# Patient Record
Sex: Female | Born: 1985 | Race: White | Hispanic: No | Marital: Married | Smoking: Never smoker
Health system: Southern US, Community
[De-identification: ages and names within clinical notes are randomized; demographics above are authoritative.]

## PROBLEM LIST (undated history)

## (undated) DIAGNOSIS — R112 Nausea with vomiting, unspecified: Secondary | ICD-10-CM

## (undated) DIAGNOSIS — R519 Headache, unspecified: Secondary | ICD-10-CM

## (undated) DIAGNOSIS — B2790 Infectious mononucleosis, unspecified without complication: Secondary | ICD-10-CM

## (undated) DIAGNOSIS — Z973 Presence of spectacles and contact lenses: Secondary | ICD-10-CM

## (undated) DIAGNOSIS — R51 Headache: Secondary | ICD-10-CM

## (undated) DIAGNOSIS — F419 Anxiety disorder, unspecified: Secondary | ICD-10-CM

## (undated) DIAGNOSIS — E559 Vitamin D deficiency, unspecified: Secondary | ICD-10-CM

## (undated) DIAGNOSIS — N83209 Unspecified ovarian cyst, unspecified side: Secondary | ICD-10-CM

## (undated) DIAGNOSIS — L732 Hidradenitis suppurativa: Secondary | ICD-10-CM

## (undated) DIAGNOSIS — K219 Gastro-esophageal reflux disease without esophagitis: Secondary | ICD-10-CM

## (undated) DIAGNOSIS — Z9889 Other specified postprocedural states: Secondary | ICD-10-CM

## (undated) HISTORY — DX: Infectious mononucleosis, unspecified without complication: B27.90

## (undated) HISTORY — DX: Vitamin D deficiency, unspecified: E55.9

## (undated) HISTORY — PX: OTHER SURGICAL HISTORY: SHX169

## (undated) HISTORY — DX: Hidradenitis suppurativa: L73.2

---

## 2007-02-28 DIAGNOSIS — E785 Hyperlipidemia, unspecified: Secondary | ICD-10-CM | POA: Insufficient documentation

## 2007-04-06 DIAGNOSIS — G43909 Migraine, unspecified, not intractable, without status migrainosus: Secondary | ICD-10-CM | POA: Insufficient documentation

## 2007-04-06 DIAGNOSIS — F411 Generalized anxiety disorder: Secondary | ICD-10-CM | POA: Insufficient documentation

## 2008-04-03 DIAGNOSIS — M545 Low back pain, unspecified: Secondary | ICD-10-CM | POA: Insufficient documentation

## 2008-10-16 HISTORY — PX: OTHER SURGICAL HISTORY: SHX169

## 2009-10-16 HISTORY — PX: OTHER SURGICAL HISTORY: SHX169

## 2011-10-20 HISTORY — PX: ABDOMINAL HYSTERECTOMY: SHX81

## 2012-04-23 ENCOUNTER — Other Ambulatory Visit: Payer: Self-pay | Admitting: Obstetrics & Gynecology

## 2012-04-23 DIAGNOSIS — N644 Mastodynia: Secondary | ICD-10-CM

## 2012-04-30 ENCOUNTER — Ambulatory Visit
Admission: RE | Admit: 2012-04-30 | Discharge: 2012-04-30 | Disposition: A | Discharge status: Home / Self Care | Payer: BC Managed Care – PPO | Source: Ambulatory Visit | Attending: Obstetrics & Gynecology | Admitting: Obstetrics & Gynecology

## 2012-04-30 DIAGNOSIS — N644 Mastodynia: Secondary | ICD-10-CM

## 2013-04-02 ENCOUNTER — Ambulatory Visit (INDEPENDENT_AMBULATORY_CARE_PROVIDER_SITE_OTHER): Payer: 59 | Admitting: Cardiology

## 2013-04-02 ENCOUNTER — Encounter (INDEPENDENT_AMBULATORY_CARE_PROVIDER_SITE_OTHER): Payer: Self-pay

## 2013-04-02 VITALS — BP 102/80 | HR 68 | Ht 59.0 in | Wt 109.0 lb

## 2013-04-02 DIAGNOSIS — R002 Palpitations: Secondary | ICD-10-CM

## 2013-04-02 LAB — CBC WITH DIFFERENTIAL/PLATELET
Basophils Absolute: 0 10*3/uL (ref 0.0–0.1)
Basophils Relative: 0.4 % (ref 0.0–3.0)
Eosinophils Absolute: 0 10*3/uL (ref 0.0–0.7)
Eosinophils Relative: 0.4 % (ref 0.0–5.0)
HCT: 39 % (ref 36.0–46.0)
Hemoglobin: 13.2 g/dL (ref 12.0–15.0)
Lymphocytes Relative: 35.2 % (ref 12.0–46.0)
Lymphs Abs: 2.6 10*3/uL (ref 0.7–4.0)
MCHC: 33.9 g/dL (ref 30.0–36.0)
MCV: 88.7 fl (ref 78.0–100.0)
Monocytes Absolute: 0.6 10*3/uL (ref 0.1–1.0)
Monocytes Relative: 7.7 % (ref 3.0–12.0)
Neutro Abs: 4.1 10*3/uL (ref 1.4–7.7)
Neutrophils Relative %: 56.3 % (ref 43.0–77.0)
Platelets: 290 10*3/uL (ref 150.0–400.0)
RBC: 4.4 Mil/uL (ref 3.87–5.11)
RDW: 13.1 % (ref 11.5–14.6)
WBC: 7.3 10*3/uL (ref 4.5–10.5)

## 2013-04-02 LAB — COMPREHENSIVE METABOLIC PANEL
ALT: 16 U/L (ref 0–35)
AST: 15 U/L (ref 0–37)
Albumin: 4.6 g/dL (ref 3.5–5.2)
Alkaline Phosphatase: 42 U/L (ref 39–117)
BUN: 9 mg/dL (ref 6–23)
CO2: 25 mEq/L (ref 19–32)
Calcium: 9.2 mg/dL (ref 8.4–10.5)
Chloride: 104 mEq/L (ref 96–112)
Creatinine, Ser: 0.6 mg/dL (ref 0.4–1.2)
GFR: 139.81 mL/min (ref >60.00)
Glucose, Bld: 84 mg/dL (ref 70–99)
Potassium: 3.8 mEq/L (ref 3.5–5.1)
Sodium: 138 mEq/L (ref 135–145)
Total Bilirubin: 0.7 mg/dL (ref 0.3–1.2)
Total Protein: 7.5 g/dL (ref 6.0–8.3)

## 2013-04-02 LAB — TSH: TSH: 1.66 u[IU]/mL (ref 0.35–5.50)

## 2013-04-02 NOTE — Progress Notes (Signed)
Patient ID: Berdella Bacot, female   DOB: 29-Oct-1985, 27 y.o.   MRN: [id number]     Patient Name: Kristi Robertson Date of Encounter: 04/02/2013  Primary Care Provider:  No primary provider on file. Primary Cardiologist:  Tobias Alexander, H  Problem List   No past medical history on file. No past surgical history on file.  Allergies  Allergies not on file  HPI  A pleasant 27 year old female with no PMH on no medication who has been experiencing episodes of palpitations. She states that in the past several months she woke up on 2 occasions with palpitations and SOB. She was given Xanax for it. Recently she has been experiencing a lot of stress with her husband illness and frequent travelling. She states that this time her palpitations feel like extra beats followed by a pause and they are happening on a regular basis all day long. No obvious precipitating or alleviating factors. No associated symptoms. She is very physically active and has no limitations.   Home Medications  Prior to Admission medications   Not on File   Family History  No family history on file.  Social History  History   Social History  . Marital Status: Married    Spouse Name: N/A    Number of Children: N/A  . Years of Education: N/A   Occupational History  . Not on file.   Social History Main Topics  . Smoking status: Not on file  . Smokeless tobacco: Not on file  . Alcohol Use: Not on file  . Drug Use: Not on file  . Sexual Activity: Not on file   Other Topics Concern  . Not on file   Social History Narrative  . No narrative on file     Review of Systems, as per HPI, otherwise negative General:  No chills, fever, night sweats or weight changes.  Cardiovascular:  No chest pain, dyspnea on exertion, edema, orthopnea, palpitations, paroxysmal nocturnal dyspnea. Dermatological: No rash, lesions/masses Respiratory: No cough, dyspnea Urologic: No hematuria, dysuria Abdominal:   No  nausea, vomiting, diarrhea, bright red blood per rectum, melena, or hematemesis Neurologic:  No visual changes, wkns, changes in mental status. All other systems reviewed and are otherwise negative except as noted above.  Physical Exam  There were no vitals taken for this visit.  General: Pleasant, NAD Psych: Normal affect. Neuro: Alert and oriented X 3. Moves all extremities spontaneously. HEENT: Normal  Neck: Supple without bruits or JVD. Lungs:  Resp regular and unlabored, CTA. Heart: RRR no s3, s4, or murmurs. Abdomen: Soft, non-tender, non-distended, BS + x 4.  Extremities: No clubbing, cyanosis or edema. DP/PT/Radials 2+ and equal bilaterally.  Labs:  No results found for this basename: CKTOTAL, CKMB, TROPONINI,  in the last 72 hours No results found for this basename: WBC, HGB, HCT, MCV, PLT   No results found for this basename: NA, K, CL, CO2, BUN, CREATININE, CALCIUM, LABALBU, PROT, BILITOT, ALKPHOS, ALT, AST, GLUCOSE,  in the last 168 hours No results found for this basename: CHOL, HDL, LDLCALC, TRIG   No results found for this basename: DDIMER   No components found with this basename: POCBNP,   Accessory Clinical Findings  echocardiogram  ECG - NSR, 75 BPM, normal ECG   Assessment & Plan  A 26 year old female with palpitations that are most probably PVCs followed by compensatory pauses. The patient also experiences episodes of sudden onset tachycardi that awakes her from sleep and is associated with  SOB.  We will oredr labs inluding CBC, CMP and TSH and a 48 Hour Holter monitor.  Follow up in 2 weeks.    Lars Masson, MD, [facility] 04/02/2013, 2:16 PM

## 2013-04-02 NOTE — Patient Instructions (Signed)
Schedule 48 hour holter monitor    Lab work today  ( cbc,cmet,tsh )    Your physician recommends that you schedule a follow-up appointment in: 2 weeks.

## 2013-04-03 DIAGNOSIS — R002 Palpitations: Secondary | ICD-10-CM | POA: Insufficient documentation

## 2013-04-04 ENCOUNTER — Telehealth: Payer: Self-pay | Admitting: Cardiology

## 2013-04-04 NOTE — Telephone Encounter (Signed)
New message    Returning a nurses call to get lab results.dp

## 2013-04-04 NOTE — Telephone Encounter (Signed)
Called patient with lab results.  

## 2013-04-05 ENCOUNTER — Encounter: Payer: Self-pay | Admitting: Radiology

## 2013-04-05 ENCOUNTER — Encounter (INDEPENDENT_AMBULATORY_CARE_PROVIDER_SITE_OTHER): Payer: 59

## 2013-04-05 DIAGNOSIS — R002 Palpitations: Secondary | ICD-10-CM

## 2013-04-05 NOTE — Progress Notes (Signed)
Patient ID: Kristi Robertson, female   DOB: 1986-04-09, 27 y.o.   MRN: [id number] E Cardio 48hr holter monitor applied

## 2013-04-10 ENCOUNTER — Other Ambulatory Visit: Payer: Self-pay | Admitting: Gastroenterology

## 2013-04-10 DIAGNOSIS — R1011 Right upper quadrant pain: Secondary | ICD-10-CM

## 2013-04-10 DIAGNOSIS — R110 Nausea: Secondary | ICD-10-CM

## 2013-04-17 ENCOUNTER — Ambulatory Visit: Payer: 59 | Admitting: Cardiology

## 2013-04-17 ENCOUNTER — Telehealth: Payer: Self-pay | Admitting: *Deleted

## 2013-04-17 NOTE — Telephone Encounter (Signed)
Left message on personal voicemail holter monitor results were reported normal by Dr. Henderson Newcomer RN

## 2013-04-19 ENCOUNTER — Other Ambulatory Visit: Payer: 59

## 2013-04-24 ENCOUNTER — Telehealth: Payer: Self-pay

## 2013-04-24 NOTE — Telephone Encounter (Signed)
2nd attempt.lmom pt holter monitor results are normal and were reviewed by Dr.Nelson

## 2013-04-25 ENCOUNTER — Ambulatory Visit
Admission: RE | Admit: 2013-04-25 | Discharge: 2013-04-25 | Disposition: A | Discharge status: Home / Self Care | Payer: 59 | Source: Ambulatory Visit | Attending: Gastroenterology | Admitting: Gastroenterology

## 2013-04-25 DIAGNOSIS — R110 Nausea: Secondary | ICD-10-CM

## 2013-04-25 DIAGNOSIS — R1011 Right upper quadrant pain: Secondary | ICD-10-CM

## 2013-04-25 MED ORDER — IOHEXOL 300 MG/ML  SOLN
100.0000 mL | Freq: Once | INTRAMUSCULAR | Status: AC | PRN
  Administered 2013-04-25: 100 mL via INTRAVENOUS

## 2013-04-30 ENCOUNTER — Other Ambulatory Visit: Payer: Self-pay | Admitting: Gastroenterology

## 2013-04-30 DIAGNOSIS — K769 Liver disease, unspecified: Secondary | ICD-10-CM

## 2013-05-03 ENCOUNTER — Ambulatory Visit (INDEPENDENT_AMBULATORY_CARE_PROVIDER_SITE_OTHER): Payer: 59 | Admitting: Cardiology

## 2013-05-03 ENCOUNTER — Ambulatory Visit
Admission: RE | Admit: 2013-05-03 | Discharge: 2013-05-03 | Disposition: A | Discharge status: Home / Self Care | Payer: 59 | Source: Ambulatory Visit | Attending: Gastroenterology | Admitting: Gastroenterology

## 2013-05-03 ENCOUNTER — Encounter: Payer: Self-pay | Admitting: Cardiology

## 2013-05-03 VITALS — BP 100/70 | HR 80 | Ht 59.0 in | Wt 108.8 lb

## 2013-05-03 DIAGNOSIS — R0789 Other chest pain: Secondary | ICD-10-CM

## 2013-05-03 DIAGNOSIS — K769 Liver disease, unspecified: Secondary | ICD-10-CM

## 2013-05-03 DIAGNOSIS — R002 Palpitations: Secondary | ICD-10-CM

## 2013-05-03 NOTE — Patient Instructions (Signed)
Your physician recommends that you continue on your current medications as directed. Please refer to the Current Medication list given to you today.  Your physician recommends that you schedule a follow-up appointment in: as needed  

## 2013-05-03 NOTE — Progress Notes (Signed)
Patient ID: Kristi Robertson, female   DOB: 08/23/1985, 28 y.o.   MRN: [id number]     Patient Name: Kristi Robertson Date of Encounter: 05/03/2013  Primary Care Provider:  HEDGECOCK,SUZANNE, PA-C Primary Cardiologist:  Tobias AlexanderNELSON, Shuayb Schepers, H  Problem List   No past medical history on file. No past surgical history on file.  Allergies  Allergies  Allergen Reactions  . Bactrim [Sulfamethoxazole-Tmp Ds]   . Codeine   . Demerol [Meperidine]   . Keflex [Cephalexin]     HPI  A pleasant 28 year old female with no PMH on no medication who has been experiencing episodes of palpitations. She states that in the past several months she woke up on 2 occasions with palpitations and SOB. She was given Xanax for it. Recently she has been experiencing a lot of stress with her husband illness and frequent travelling. She states that this time her palpitations feel like extra beats followed by a pause and they are happening on a regular basis all day long. No obvious precipitating or alleviating factors. No associated symptoms. She is very physically active and has no limitations.   Home Medications  Prior to Admission medications   Not on File   Family History  No family history on file.  Social History  History   Social History  . Marital Status: Married    Spouse Name: N/A    Number of Children: N/A  . Years of Education: N/A   Occupational History  . Not on file.   Social History Main Topics  . Smoking status: Never Smoker   . Smokeless tobacco: Not on file  . Alcohol Use: Not on file  . Drug Use: Not on file  . Sexual Activity: Not on file   Other Topics Concern  . Not on file   Social History Narrative  . No narrative on file     Review of Systems, as per HPI, otherwise negative General:  No chills, fever, night sweats or weight changes.  Cardiovascular:  No chest pain, dyspnea on exertion, edema, orthopnea, palpitations, paroxysmal nocturnal  dyspnea. Dermatological: No rash, lesions/masses Respiratory: No cough, dyspnea Urologic: No hematuria, dysuria Abdominal:   No nausea, vomiting, diarrhea, bright red blood per rectum, melena, or hematemesis Neurologic:  No visual changes, wkns, changes in mental status. All other systems reviewed and are otherwise negative except as noted above.  Physical Exam  Blood pressure 100/70, pulse 80, height 4\' 11"  (1.499 m), weight 108 lb 12.8 oz (49.351 kg).  General: Pleasant, NAD Psych: Normal affect. Neuro: Alert and oriented X 3. Moves all extremities spontaneously. HEENT: Normal  Neck: Supple without bruits or JVD. Lungs:  Resp regular and unlabored, CTA. Heart: RRR no s3, s4, or murmurs. Abdomen: Soft, non-tender, non-distended, BS + x 4.  Extremities: No clubbing, cyanosis or edema. DP/PT/Radials 2+ and equal bilaterally.  Labs: CMP     Component Value Date/Time   NA 138 04/02/2013 1536   K 3.8 04/02/2013 1536   CL 104 04/02/2013 1536   CO2 25 04/02/2013 1536   GLUCOSE 84 04/02/2013 1536   BUN 9 04/02/2013 1536   CREATININE 0.6 04/02/2013 1536   CALCIUM 9.2 04/02/2013 1536   PROT 7.5 04/02/2013 1536   ALBUMIN 4.6 04/02/2013 1536   AST 15 04/02/2013 1536   ALT 16 04/02/2013 1536   ALKPHOS 42 04/02/2013 1536   BILITOT 0.7 04/02/2013 1536   CBC    Component Value Date/Time   WBC 7.3 04/02/2013 1536   RBC  4.40 04/02/2013 1536   HGB 13.2 04/02/2013 1536   HCT 39.0 04/02/2013 1536   PLT 290.0 04/02/2013 1536   MCV 88.7 04/02/2013 1536   MCHC 33.9 04/02/2013 1536   RDW 13.1 04/02/2013 1536   LYMPHSABS 2.6 04/02/2013 1536   MONOABS 0.6 04/02/2013 1536   EOSABS 0.0 04/02/2013 1536   BASOSABS 0.0 04/02/2013 1536   TSH 1.66  Accessory Clinical Findings  ECG - NSR, 75 BPM, normal ECG   Assessment & Plan  A 28 year old female with palpitations and atypical sharp pains that are most probably related to GERD, currently on twice daily omeprazole. 48 Hour Holter  showed only rare PACs, she had an episode of palpitations and chest pain that had no correlation with Holter monitoring.   All labs are normal including TSH.   Follow up as needed.   Lars Masson, MD, [facility] 05/03/2013, 3:13 PM

## 2013-05-20 ENCOUNTER — Emergency Department (HOSPITAL_COMMUNITY): Payer: 59

## 2013-05-20 ENCOUNTER — Emergency Department (HOSPITAL_COMMUNITY)
Admission: EM | Admit: 2013-05-20 | Discharge: 2013-05-20 | Disposition: A | Discharge status: Home / Self Care | Payer: 59 | Attending: Emergency Medicine | Admitting: Emergency Medicine

## 2013-05-20 ENCOUNTER — Encounter (HOSPITAL_COMMUNITY): Payer: Self-pay | Admitting: Emergency Medicine

## 2013-05-20 DIAGNOSIS — Z79899 Other long term (current) drug therapy: Secondary | ICD-10-CM | POA: Insufficient documentation

## 2013-05-20 DIAGNOSIS — G8929 Other chronic pain: Secondary | ICD-10-CM | POA: Insufficient documentation

## 2013-05-20 DIAGNOSIS — R6883 Chills (without fever): Secondary | ICD-10-CM | POA: Insufficient documentation

## 2013-05-20 DIAGNOSIS — K219 Gastro-esophageal reflux disease without esophagitis: Secondary | ICD-10-CM | POA: Insufficient documentation

## 2013-05-20 DIAGNOSIS — R1011 Right upper quadrant pain: Secondary | ICD-10-CM | POA: Insufficient documentation

## 2013-05-20 DIAGNOSIS — R0789 Other chest pain: Secondary | ICD-10-CM | POA: Insufficient documentation

## 2013-05-20 DIAGNOSIS — R110 Nausea: Secondary | ICD-10-CM | POA: Insufficient documentation

## 2013-05-20 DIAGNOSIS — Z90710 Acquired absence of both cervix and uterus: Secondary | ICD-10-CM | POA: Insufficient documentation

## 2013-05-20 HISTORY — DX: Gastro-esophageal reflux disease without esophagitis: K21.9

## 2013-05-20 LAB — CBC WITH DIFFERENTIAL/PLATELET
BASOS ABS: 0 10*3/uL (ref 0.0–0.1)
BASOS PCT: 0 % (ref 0–1)
EOS ABS: 0 10*3/uL (ref 0.0–0.7)
EOS PCT: 0 % (ref 0–5)
HEMATOCRIT: 37.6 % (ref 36.0–46.0)
Hemoglobin: 13.3 g/dL (ref 12.0–15.0)
Lymphocytes Relative: 22 % (ref 12–46)
Lymphs Abs: 1.8 10*3/uL (ref 0.7–4.0)
MCH: 30.8 pg (ref 26.0–34.0)
MCHC: 35.4 g/dL (ref 30.0–36.0)
MCV: 87 fL (ref 78.0–100.0)
MONO ABS: 0.8 10*3/uL (ref 0.1–1.0)
Monocytes Relative: 9 % (ref 3–12)
Neutro Abs: 5.8 10*3/uL (ref 1.7–7.7)
Neutrophils Relative %: 69 % (ref 43–77)
Platelets: 270 10*3/uL (ref 150–400)
RBC: 4.32 MIL/uL (ref 3.87–5.11)
RDW: 13.2 % (ref 11.5–15.5)
WBC: 8.4 10*3/uL (ref 4.0–10.5)

## 2013-05-20 LAB — COMPREHENSIVE METABOLIC PANEL
ALBUMIN: 4.1 g/dL (ref 3.5–5.2)
ALT: 17 U/L (ref 0–35)
AST: 15 U/L (ref 0–37)
Alkaline Phosphatase: 55 U/L (ref 39–117)
BUN: 10 mg/dL (ref 6–23)
CALCIUM: 9.1 mg/dL (ref 8.4–10.5)
CO2: 24 meq/L (ref 19–32)
CREATININE: 0.57 mg/dL (ref 0.50–1.10)
Chloride: 102 mEq/L (ref 96–112)
GFR calc Af Amer: >90 mL/min (ref >90)
GFR calc non Af Amer: >90 mL/min (ref >90)
Glucose, Bld: 100 mg/dL — ABNORMAL HIGH (ref 70–99)
Potassium: 4.2 mEq/L (ref 3.7–5.3)
SODIUM: 139 meq/L (ref 137–147)
TOTAL PROTEIN: 7.5 g/dL (ref 6.0–8.3)
Total Bilirubin: 0.5 mg/dL (ref 0.3–1.2)

## 2013-05-20 LAB — URINALYSIS, ROUTINE W REFLEX MICROSCOPIC
Bilirubin Urine: NEGATIVE
GLUCOSE, UA: NEGATIVE mg/dL
Hgb urine dipstick: NEGATIVE
Ketones, ur: NEGATIVE mg/dL
LEUKOCYTES UA: NEGATIVE
Nitrite: NEGATIVE
Protein, ur: NEGATIVE mg/dL
SPECIFIC GRAVITY, URINE: 1.01 (ref 1.005–1.030)
Urobilinogen, UA: 0.2 mg/dL (ref 0.0–1.0)
pH: 6 (ref 5.0–8.0)

## 2013-05-20 LAB — LIPASE, BLOOD: Lipase: 20 U/L (ref 11–59)

## 2013-05-20 MED ORDER — ONDANSETRON HCL 4 MG/2ML IJ SOLN
4.0000 mg | Freq: Once | INTRAMUSCULAR | Status: AC
  Administered 2013-05-20: 4 mg via INTRAVENOUS
  Filled 2013-05-20: qty 2

## 2013-05-20 MED ORDER — ONDANSETRON 4 MG PO TBDP
ORAL_TABLET | ORAL | Status: DC
Start: 2013-05-20 — End: 2013-07-03

## 2013-05-20 MED ORDER — SODIUM CHLORIDE 0.9 % IV BOLUS (SEPSIS)
1000.0000 mL | Freq: Once | INTRAVENOUS | Status: AC
  Administered 2013-05-20: 1000 mL via INTRAVENOUS

## 2013-05-20 MED ORDER — MORPHINE SULFATE 4 MG/ML IJ SOLN
4.0000 mg | Freq: Once | INTRAMUSCULAR | Status: AC
  Administered 2013-05-20: 4 mg via INTRAVENOUS
  Filled 2013-05-20: qty 1

## 2013-05-20 MED ORDER — ONDANSETRON HCL 4 MG PO TABS: 4.0000 mg | ORAL_TABLET | Freq: Four times a day (QID) | ORAL | Status: DC

## 2013-05-20 NOTE — ED Provider Notes (Signed)
CSN: [id number]     Arrival date & time 05/20/13  1204 History   None    Chief Complaint  Patient presents with  . Abdominal Pain   (Consider location/radiation/quality/duration/timing/severity/associated sxs/prior Treatment) HPI  28 year old female with hx of GERD presents with c/o RUQ abd pain.  Pt report hx of recurrent Sharp, stabbing, throbbing RUQ pain occasionally radiates to epigastric or down to lower abd since 2010 but now progressively worse within the past 2 months.  Pain occasionally associated with eating but sometimes wakes her up at night.  Report chills and nausea with it.  Pain worse after eating today.  Sts she has had extensive work up including US, HIDA scan, CT scan in the past without definitive finding.  Pt Also report occasional chest pressure sensation with her abd pain, and has been evaluated by cardiology for same in Dec without obvious finding.  She has an intact gall bladder and appendix.  Has total histerectomy.  Denies fever, headache, sob, back pain, dysuria, hematuria or rash.  Has tried taking advil without relief.    Past Medical History  Diagnosis Date  . Acid reflux    No past surgical history on file. No family history on file. History  Substance Use Topics  . Smoking status: Never Smoker   . Smokeless tobacco: Not on file  . Alcohol Use: Not on file   OB History   Grav Para Term Preterm Abortions TAB SAB Ect Mult Living                 Review of Systems  All other systems reviewed and are negative.    Allergies  Bactrim; Codeine; Demerol; Keflex; and Sulfa antibiotics  Home Medications   Current Outpatient Rx  Name  Route  Sig  Dispense  Refill  . ibuprofen (ADVIL,MOTRIN) 200 MG tablet   Oral   Take 400 mg by mouth every 6 (six) hours as needed for headache.          Marland Kitchen. omeprazole (PRILOSEC) 20 MG capsule   Oral   Take 40 mg by mouth 2 (two) times daily before a meal.          . ranitidine (ZANTAC) 150 MG capsule   Oral  Take 150 mg by mouth at bedtime.          BP 122/72  Pulse 79  Temp(Src) 98.1 F (36.7 C) (Other (Comment))  Resp 20  Ht 4\' 11"  (1.499 m)  Wt 108 lb (48.988 kg)  BMI 21.80 kg/m2  SpO2 100% Physical Exam  Nursing note and vitals reviewed. Constitutional: She is oriented to person, place, and time. She appears well-developed and well-nourished. No distress.  Awake, alert, nontoxic appearance  HENT:  Head: Atraumatic.  Eyes: Conjunctivae are normal. Right eye exhibits no discharge. Left eye exhibits no discharge.  Neck: Neck supple.  Cardiovascular: Normal rate and regular rhythm.   Pulmonary/Chest: Effort normal. No respiratory distress. She exhibits no tenderness.  Abdominal: Soft. Bowel sounds are normal. She exhibits no distension. There is tenderness (generalized diffused tenderness to epigastric, RUQ and periumbilical.  No McBurney's point.  No guarding or rebound tenderness.  ). There is no rebound and no guarding.  Genitourinary:  No cva tenderness  Musculoskeletal: She exhibits no tenderness.  ROM appears intact, no obvious focal weakness  Neurological: She is alert and oriented to person, place, and time.  Mental status and motor strength appears intact  Skin: No rash noted.  Psychiatric: She has  a normal mood and affect.    ED Course  Procedures (including critical care time)  3:18 PM Pt with recurrent RUQ abd pain.  Last abd [place] was 2 weeks ago, without evidence of biliary disease. She has a non surgical abdomen, UA neg, CBC normal, CMP normal, lipase normal.  Has hysterectomy.  Doubt appendicitis, or biliary disease.  Will obtain acute abd series with chest.    5:22 PM Acute abdominal series without any acute finding. Patient currently feels comfortable. She is able to tolerates by mouth. Patient states she has a GI specialist in [place] that has been evaluated for her symptoms keeps saying that patient either has constipation or gastric reflux. She has been  taking MiraLAX and also taking Bentyl with minimal relief. She request for followup with [facility], Dr. Rhea Belton.  Will give referral. Return precaution discussed.  Care discussed with Dr. Freida Busman  Labs Review Labs Reviewed  COMPREHENSIVE METABOLIC PANEL - Abnormal; Notable for the following:    Glucose, Bld 100 (*)    All other components within normal limits  CBC WITH DIFFERENTIAL  LIPASE, BLOOD  URINALYSIS, ROUTINE W REFLEX MICROSCOPIC   Imaging Review Dg Abd Acute W/chest  05/20/2013   CLINICAL DATA:  Abdominal pain  EXAM: ACUTE ABDOMEN SERIES (ABDOMEN 2 VIEW & CHEST 1 VIEW)  COMPARISON:  04/25/2013  FINDINGS: There is no evidence of dilated bowel loops or free intraperitoneal air. No radiopaque calculi or other significant radiographic abnormality is seen. Heart size and mediastinal contours are within normal limits. Both lungs are clear.  IMPRESSION: No acute abnormality noted.   Electronically Signed   By: Alcide Clever M.D.   On: 05/20/2013 16:52    EKG Interpretation   None       MDM   1. Abdominal pain, chronic, right upper quadrant    BP 126/70  Pulse 87  Temp(Src) 98.1 F (36.7 C) (Other (Comment))  Resp 20  Ht 4\' 11"  (1.499 m)  Wt 108 lb (48.988 kg)  BMI 21.80 kg/m2  SpO2 100%  I have reviewed nursing notes and vital signs. I personally reviewed the imaging tests through PACS system  I reviewed available ER/hospitalization records thought the EMR     Fayrene Helper, [place] 05/20/13 1724

## 2013-05-20 NOTE — ED Provider Notes (Signed)
Medical screening examination/treatment/procedure(s) were performed by non-physician practitioner and as supervising physician I was immediately available for consultation/collaboration.  EKG Interpretation   None        Rashard Ryle T Zariah Jost, MD 05/20/13 2305 

## 2013-05-20 NOTE — Discharge Instructions (Signed)
Please follow up closely with Dr. Rhea BeltonPyrtle as requested for further evaluation and management of your abdominal pain.  Take zofran as needed for nausea.  Return if your symptoms worsen or if you have other concerns.    Abdominal Pain, Adult Many things can cause belly (abdominal) pain. Most times, the belly pain is not dangerous. Many cases of belly pain can be watched and treated at home. HOME CARE   Do not take medicines that help you go poop (laxatives) unless told to by your doctor.  Only take medicine as told by your doctor.  Eat or drink as told by your doctor. Your doctor will tell you if you should be on a special diet. GET HELP IF:  You do not know what is causing your belly pain.  You have belly pain while you are sick to your stomach (nauseous) or have runny poop (diarrhea).  You have pain while you pee or poop.  Your belly pain wakes you up at night.  You have belly pain that gets worse or better when you eat.  You have belly pain that gets worse when you eat fatty foods. GET HELP RIGHT AWAY IF:   The pain does not go away within 2 hours.  You have a fever.  You keep throwing up (vomiting).  The pain changes and is only in the right or left part of the belly.  You have bloody or tarry looking poop. MAKE SURE YOU:   Understand these instructions.  Will watch your condition.  Will get help right away if you are not doing well or get worse. Document Released: 09/21/2007 Document Revised: 01/23/2013 Document Reviewed: 12/12/2012 [facility] Patient Information 2014 [employer] [place].

## 2013-05-20 NOTE — ED Notes (Signed)
Pt reports abd pain, below right ribs, worse when eating. sts she still has her gallbladder

## 2013-06-21 ENCOUNTER — Encounter (INDEPENDENT_AMBULATORY_CARE_PROVIDER_SITE_OTHER): Payer: Self-pay | Admitting: General Surgery

## 2013-06-21 ENCOUNTER — Ambulatory Visit (INDEPENDENT_AMBULATORY_CARE_PROVIDER_SITE_OTHER): Payer: 59 | Admitting: General Surgery

## 2013-06-21 VITALS — BP 122/70 | HR 80 | Temp 98.6°F | Resp 16 | Ht 59.0 in | Wt 106.0 lb

## 2013-06-21 DIAGNOSIS — L732 Hidradenitis suppurativa: Secondary | ICD-10-CM

## 2013-06-21 MED ORDER — DOXYCYCLINE HYCLATE 100 MG PO TABS: 100.0000 mg | ORAL_TABLET | Freq: Two times a day (BID) | ORAL | Status: DC

## 2013-06-21 NOTE — Patient Instructions (Signed)
Plan to excise hidradenitis from left groin Take doxycycline twice a day

## 2013-06-21 NOTE — Progress Notes (Signed)
Patient ID: Kristi Robertson, female   DOB: 1985-09-04, 28 y.o.   MRN: [id number]  Chief Complaint  Patient presents with  . hidradentitis    inner lt thigh    HPI Kristi FurlSunshine Robertson is a 28 y.o. female.  We are asked to see the patient in consultation by Dr. Margarita RanaHedgecock to evaluate her for an abscess in her left groin. The patient is a 28 year old white female who is been having problems with chronic pain and swelling and drainage in her left groin area for the last 4 years. These symptoms occur intermittently. Her current symptoms of been going on for the last 3 weeks. She has some occasional drainage from the area. She denies any fevers. She does have pretty significant pain in that area. The location the vein makes it difficult for her to wear pants. She has had this area excised before by a dermatologist in [place]. She was prescribed minocycline in January but did not take the medicine. HPI  Past Medical History  Diagnosis Date  . Acid reflux     History reviewed. No pertinent past surgical history.  History reviewed. No pertinent family history.  Social History History  Substance Use Topics  . Smoking status: Never Smoker   . Smokeless tobacco: Not on file  . Alcohol Use: Not on file    Allergies  Allergen Reactions  . Bactrim [Sulfamethoxazole-Tmp Ds] Other (See Comments)    Heart racing  . Codeine Nausea And Vomiting  . Demerol [Meperidine] Nausea And Vomiting  . Keflex [Cephalexin] Other (See Comments)    Heart racing  . Sulfa Antibiotics Other (See Comments)    Heart racing    Current Outpatient Prescriptions  Medication Sig Dispense Refill  . dicyclomine (BENTYL) 10 MG capsule       . ibuprofen (ADVIL,MOTRIN) 200 MG tablet Take 400 mg by mouth every 6 (six) hours as needed for headache.       Marland Kitchen. omeprazole (PRILOSEC) 20 MG capsule Take 40 mg by mouth 2 (two) times daily before a meal.       . ondansetron (ZOFRAN ODT) 4 MG disintegrating tablet 4mg  ODT q4 hours prn  nausea/vomit  12 tablet  0  . ranitidine (ZANTAC) 150 MG capsule Take 150 mg by mouth at bedtime.       No current facility-administered medications for this visit.    Review of Systems Review of Systems  Constitutional: Negative.   HENT: Negative.   Eyes: Negative.   Respiratory: Negative.   Cardiovascular: Negative.   Gastrointestinal: Negative.   Endocrine: Negative.   Genitourinary: Negative.   Musculoskeletal: Negative.   Skin: Positive for wound.  Allergic/Immunologic: Negative.   Neurological: Negative.   Hematological: Negative.   Psychiatric/Behavioral: Negative.     Blood pressure 122/70, pulse 80, temperature 98.6 F (37 C), temperature source Oral, resp. rate 16, height 4\' 11"  (1.499 m), weight 106 lb (48.081 kg).  Physical Exam Physical Exam  Constitutional: She is oriented to person, place, and time. She appears well-developed and well-nourished.  HENT:  Head: Normocephalic and atraumatic.  Eyes: Conjunctivae and EOM are normal. Pupils are equal, round, and reactive to light.  Neck: Normal range of motion. Neck supple.  Cardiovascular: Normal rate, regular rhythm and normal heart sounds.   Pulmonary/Chest: Effort normal and breath sounds normal.  Abdominal: Soft. Bowel sounds are normal.  Musculoskeletal: Normal range of motion.  Lymphadenopathy:    She has no cervical adenopathy.  Neurological: She is alert and oriented to person, place,  and time.  Skin: Skin is warm and dry.  There is an area that appears to be hidradenitis in the left groin. There is some purulent drainage which we could express.  Psychiatric: She has a normal mood and affect. Her behavior is normal.    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 In my opinion the only way to keep this area from continuing to drain this to completely excise it back to healthy tissue. We may be able to close the wound  loosely versus leaving it open and changing the dressing until it heals secondarily. I've discussed with her in detail the risks and benefits of the operation to do this as well as some of the technical aspects and she understands and wishes to proceed     Plan    Plan for excision of hidradenitis from the left groin        TOTH III,PAUL S 06/21/2013, 3:25 PM

## 2013-06-28 ENCOUNTER — Ambulatory Visit (INDEPENDENT_AMBULATORY_CARE_PROVIDER_SITE_OTHER): Payer: 59 | Admitting: Surgery

## 2013-06-28 NOTE — Progress Notes (Signed)
Surgery on 07/12/13.  Preop on 07/05/13 at 100pm.  Need orders in EPIC.  Thank You.

## 2013-07-01 ENCOUNTER — Ambulatory Visit (INDEPENDENT_AMBULATORY_CARE_PROVIDER_SITE_OTHER): Payer: 59 | Admitting: Surgery

## 2013-07-01 ENCOUNTER — Other Ambulatory Visit (INDEPENDENT_AMBULATORY_CARE_PROVIDER_SITE_OTHER): Payer: Self-pay | Admitting: General Surgery

## 2013-07-03 ENCOUNTER — Encounter (HOSPITAL_COMMUNITY): Payer: Self-pay | Admitting: Pharmacy Technician

## 2013-07-04 ENCOUNTER — Other Ambulatory Visit (HOSPITAL_COMMUNITY): Payer: Self-pay | Admitting: General Surgery

## 2013-07-04 NOTE — Patient Instructions (Addendum)
20 Kristi Robertson  07/04/2013   Your procedure is scheduled on: Friday March 27th, 2015  Report to [facility] at 530  AM.  Call this number if you have problems the morning of surgery [phone]   Remember:  Do not eat food or drink liquids :After Midnight.     Take these medicines the morning of surgery with A SIP OF WATER: omeprazole, xanax if needed                                SEE [name] PREPARING FOR SURGERY SHEET             You may not have any metal on your body including hair pins and piercings  Do not wear jewelry, make-up.  Do not wear lotions, powders, or perfumes. No  Deodorant is to be worn.   Men may shave face and neck.  Do not bring valuables to the hospital. [facility] IS NOT RESPONSIBLE FOR VALUEABLES.  Contacts, dentures or bridgework may not be worn into surgery.  Leave suitcase in the car. After surgery it may be brought to your room.  For patients admitted to the hospital, checkout time is 11:00 AM the day of discharge.   Patients discharged the day of surgery will not be allowed to drive home.  Name and phone number of your driver: spouse [name] cell [phone]  Special Instructions: N/A  Please read over the following fact sheets that you were given: [facility] Pre[facility].  Call [name] RN pre op nurse if needed [phone]    FAILURE TO FOLLOW THESE INSTRUCTIONS MAY RESULT IN THE CANCELLATION OF YOUR SURGERY.  PATIENT SIGNATURE___________________________________________  NURSE SIGNATURE_____________________________________________

## 2013-07-05 ENCOUNTER — Encounter (HOSPITAL_COMMUNITY): Payer: Self-pay

## 2013-07-05 ENCOUNTER — Encounter (HOSPITAL_COMMUNITY)
Admission: RE | Admit: 2013-07-05 | Discharge: 2013-07-05 | Disposition: A | Discharge status: Home / Self Care | Payer: 59 | Source: Ambulatory Visit | Attending: General Surgery | Admitting: General Surgery

## 2013-07-05 DIAGNOSIS — Z01812 Encounter for preprocedural laboratory examination: Secondary | ICD-10-CM | POA: Insufficient documentation

## 2013-07-05 DIAGNOSIS — L732 Hidradenitis suppurativa: Secondary | ICD-10-CM | POA: Insufficient documentation

## 2013-07-05 HISTORY — DX: Nausea with vomiting, unspecified: R11.2

## 2013-07-05 HISTORY — DX: Anxiety disorder, unspecified: F41.9

## 2013-07-05 HISTORY — DX: Other specified postprocedural states: Z98.890

## 2013-07-05 LAB — CBC
HEMATOCRIT: 39.5 % (ref 36.0–46.0)
Hemoglobin: 13.6 g/dL (ref 12.0–15.0)
MCH: 30.4 pg (ref 26.0–34.0)
MCHC: 34.4 g/dL (ref 30.0–36.0)
MCV: 88.2 fL (ref 78.0–100.0)
PLATELETS: 325 10*3/uL (ref 150–400)
RBC: 4.48 MIL/uL (ref 3.87–5.11)
RDW: 12.9 % (ref 11.5–15.5)
WBC: 9 10*3/uL (ref 4.0–10.5)

## 2013-07-05 NOTE — Progress Notes (Signed)
lov 04-02-13 dr Delton Seenelson cardiology epic Db abd with chest 1 view 05-20-13 epic 04-06-13 holter report epic 04-02-13 ekg epic

## 2013-07-12 ENCOUNTER — Encounter (HOSPITAL_COMMUNITY): Payer: Self-pay | Admitting: *Deleted

## 2013-07-12 ENCOUNTER — Telehealth (INDEPENDENT_AMBULATORY_CARE_PROVIDER_SITE_OTHER): Payer: Self-pay | Admitting: General Surgery

## 2013-07-12 ENCOUNTER — Encounter (HOSPITAL_COMMUNITY): Payer: 59 | Admitting: Registered Nurse

## 2013-07-12 ENCOUNTER — Encounter (HOSPITAL_COMMUNITY)
Admission: RE | Disposition: A | Discharge status: Home / Self Care | Payer: Self-pay | Source: Ambulatory Visit | Attending: General Surgery

## 2013-07-12 ENCOUNTER — Ambulatory Visit (HOSPITAL_COMMUNITY)
Admission: RE | Admit: 2013-07-12 | Discharge: 2013-07-12 | Disposition: A | Discharge status: Home / Self Care | Payer: 59 | Source: Ambulatory Visit | Attending: General Surgery | Admitting: General Surgery

## 2013-07-12 ENCOUNTER — Ambulatory Visit (HOSPITAL_COMMUNITY): Payer: 59 | Admitting: Registered Nurse

## 2013-07-12 DIAGNOSIS — R232 Flushing: Secondary | ICD-10-CM | POA: Insufficient documentation

## 2013-07-12 DIAGNOSIS — Z79899 Other long term (current) drug therapy: Secondary | ICD-10-CM | POA: Insufficient documentation

## 2013-07-12 DIAGNOSIS — Z881 Allergy status to other antibiotic agents status: Secondary | ICD-10-CM | POA: Insufficient documentation

## 2013-07-12 DIAGNOSIS — K219 Gastro-esophageal reflux disease without esophagitis: Secondary | ICD-10-CM | POA: Insufficient documentation

## 2013-07-12 DIAGNOSIS — G8929 Other chronic pain: Secondary | ICD-10-CM | POA: Insufficient documentation

## 2013-07-12 DIAGNOSIS — Z882 Allergy status to sulfonamides status: Secondary | ICD-10-CM | POA: Insufficient documentation

## 2013-07-12 DIAGNOSIS — Z885 Allergy status to narcotic agent status: Secondary | ICD-10-CM | POA: Insufficient documentation

## 2013-07-12 DIAGNOSIS — L732 Hidradenitis suppurativa: Secondary | ICD-10-CM

## 2013-07-12 HISTORY — PX: INGUINAL HERNIA REPAIR: SHX194

## 2013-07-12 SURGERY — REPAIR, HERNIA, INGUINAL, ADULT
Anesthesia: General

## 2013-07-12 MED ORDER — ACETAMINOPHEN 10 MG/ML IV SOLN
1000.0000 mg | Freq: Four times a day (QID) | INTRAVENOUS | Status: DC
  Administered 2013-07-12: 1000 mg via INTRAVENOUS
  Filled 2013-07-12: qty 100

## 2013-07-12 MED ORDER — TRAMADOL HCL 50 MG PO TABS: 50.0000 mg | ORAL_TABLET | Freq: Four times a day (QID) | ORAL | Status: DC | PRN

## 2013-07-12 MED ORDER — VANCOMYCIN HCL IN DEXTROSE 1-5 GM/200ML-% IV SOLN
INTRAVENOUS | Status: AC
  Filled 2013-07-12: qty 200

## 2013-07-12 MED ORDER — BACITRACIN-NEOMYCIN-POLYMYXIN 400-5-5000 EX OINT
TOPICAL_OINTMENT | CUTANEOUS | Status: AC
  Filled 2013-07-12: qty 1

## 2013-07-12 MED ORDER — LACTATED RINGERS IV SOLN
INTRAVENOUS | Status: DC
  Administered 2013-07-12: 09:00:00 via INTRAVENOUS

## 2013-07-12 MED ORDER — EPHEDRINE SULFATE 50 MG/ML IJ SOLN
INTRAMUSCULAR | Status: AC
  Filled 2013-07-12: qty 1

## 2013-07-12 MED ORDER — BUPIVACAINE-EPINEPHRINE 0.25% -1:200000 IJ SOLN
INTRAMUSCULAR | Status: DC | PRN
  Administered 2013-07-12: 7 mL

## 2013-07-12 MED ORDER — PROMETHAZINE HCL 25 MG/ML IJ SOLN: 6.2500 mg | INTRAMUSCULAR | Status: DC | PRN

## 2013-07-12 MED ORDER — DIPHENHYDRAMINE HCL 50 MG/ML IJ SOLN
INTRAMUSCULAR | Status: AC
  Filled 2013-07-12: qty 1

## 2013-07-12 MED ORDER — ONDANSETRON HCL 4 MG/2ML IJ SOLN
INTRAMUSCULAR | Status: AC
  Filled 2013-07-12: qty 2

## 2013-07-12 MED ORDER — FENTANYL CITRATE 0.05 MG/ML IJ SOLN
INTRAMUSCULAR | Status: AC
  Filled 2013-07-12: qty 5

## 2013-07-12 MED ORDER — OXYCODONE-ACETAMINOPHEN 5-325 MG PO TABS: 1.0000 | ORAL_TABLET | ORAL | Status: DC | PRN

## 2013-07-12 MED ORDER — VANCOMYCIN HCL IN DEXTROSE 1-5 GM/200ML-% IV SOLN: 1000.0000 mg | INTRAVENOUS | Status: DC

## 2013-07-12 MED ORDER — FENTANYL CITRATE 0.05 MG/ML IJ SOLN
INTRAMUSCULAR | Status: DC | PRN
  Administered 2013-07-12 (×2): 50 ug via INTRAVENOUS

## 2013-07-12 MED ORDER — FENTANYL CITRATE 0.05 MG/ML IJ SOLN: 25.0000 ug | INTRAMUSCULAR | Status: DC | PRN

## 2013-07-12 MED ORDER — SCOPOLAMINE 1 MG/3DAYS TD PT72
MEDICATED_PATCH | TRANSDERMAL | Status: DC | PRN
  Administered 2013-07-12: 1 via TRANSDERMAL

## 2013-07-12 MED ORDER — SODIUM CHLORIDE 0.9 % IJ SOLN
INTRAMUSCULAR | Status: AC
  Filled 2013-07-12: qty 10

## 2013-07-12 MED ORDER — DEXAMETHASONE SODIUM PHOSPHATE 10 MG/ML IJ SOLN
INTRAMUSCULAR | Status: DC | PRN
  Administered 2013-07-12: 10 mg via INTRAVENOUS

## 2013-07-12 MED ORDER — MIDAZOLAM HCL 5 MG/5ML IJ SOLN
INTRAMUSCULAR | Status: DC | PRN
  Administered 2013-07-12: 1 mg via INTRAVENOUS

## 2013-07-12 MED ORDER — PROPOFOL 10 MG/ML IV BOLUS
INTRAVENOUS | Status: AC
  Filled 2013-07-12: qty 20

## 2013-07-12 MED ORDER — OXYCODONE-ACETAMINOPHEN 5-325 MG PO TABS
1.0000 | ORAL_TABLET | ORAL | Status: DC | PRN
  Administered 2013-07-12: 1 via ORAL
  Filled 2013-07-12: qty 1

## 2013-07-12 MED ORDER — LACTATED RINGERS IV SOLN
INTRAVENOUS | Status: DC | PRN
  Administered 2013-07-12: 07:00:00 via INTRAVENOUS

## 2013-07-12 MED ORDER — DIPHENHYDRAMINE HCL 50 MG/ML IJ SOLN
25.0000 mg | Freq: Once | INTRAMUSCULAR | Status: AC
  Administered 2013-07-12: 25 mg via INTRAVENOUS

## 2013-07-12 MED ORDER — MIDAZOLAM HCL 2 MG/2ML IJ SOLN
INTRAMUSCULAR | Status: AC
  Filled 2013-07-12: qty 2

## 2013-07-12 MED ORDER — BUPIVACAINE-EPINEPHRINE PF 0.25-1:200000 % IJ SOLN
INTRAMUSCULAR | Status: AC
  Filled 2013-07-12: qty 30

## 2013-07-12 MED ORDER — DEXAMETHASONE SODIUM PHOSPHATE 10 MG/ML IJ SOLN
INTRAMUSCULAR | Status: AC
  Filled 2013-07-12: qty 1

## 2013-07-12 MED ORDER — LIDOCAINE HCL (CARDIAC) 20 MG/ML IV SOLN
INTRAVENOUS | Status: DC | PRN
  Administered 2013-07-12: 80 mg via INTRAVENOUS

## 2013-07-12 MED ORDER — LIDOCAINE HCL (CARDIAC) 20 MG/ML IV SOLN
INTRAVENOUS | Status: AC
  Filled 2013-07-12: qty 5

## 2013-07-12 MED ORDER — SCOPOLAMINE 1 MG/3DAYS TD PT72
MEDICATED_PATCH | TRANSDERMAL | Status: AC
  Filled 2013-07-12: qty 1

## 2013-07-12 MED ORDER — PROPOFOL 10 MG/ML IV BOLUS
INTRAVENOUS | Status: DC | PRN
  Administered 2013-07-12: 150 mg via INTRAVENOUS

## 2013-07-12 SURGICAL SUPPLY — 44 items
BENZOIN TINCTURE PRP APPL 2/3 (GAUZE/BANDAGES/DRESSINGS) IMPLANT
BLADE HEX COATED 2.75 (ELECTRODE) ×2 IMPLANT
BLADE SURG 15 STRL LF DISP TIS (BLADE) ×1 IMPLANT
BLADE SURG 15 STRL SS (BLADE) ×1
CANISTER SUCTION 2500CC (MISCELLANEOUS) IMPLANT
DECANTER SPIKE VIAL GLASS SM (MISCELLANEOUS) ×2 IMPLANT
DERMABOND ADVANCED (GAUZE/BANDAGES/DRESSINGS)
DERMABOND ADVANCED .7 DNX12 (GAUZE/BANDAGES/DRESSINGS) IMPLANT
DRAIN PENROSE 18X1/2 LTX STRL (DRAIN) ×2 IMPLANT
DRAPE LAPAROSCOPIC ABDOMINAL (DRAPES) IMPLANT
DRAPE LG THREE QUARTER DISP (DRAPES) ×4 IMPLANT
ELECT REM PT RETURN 9FT ADLT (ELECTROSURGICAL) ×2
ELECTRODE REM PT RTRN 9FT ADLT (ELECTROSURGICAL) ×1 IMPLANT
GLOVE BIO SURGEON STRL SZ7.5 (GLOVE) IMPLANT
GLOVE BIOGEL PI IND STRL 7.0 (GLOVE) ×1 IMPLANT
GLOVE BIOGEL PI INDICATOR 7.0 (GLOVE) ×1
GOWN STRL REUS W/ TWL XL LVL3 (GOWN DISPOSABLE) ×1 IMPLANT
GOWN STRL REUS W/TWL LRG LVL3 (GOWN DISPOSABLE) ×2 IMPLANT
GOWN STRL REUS W/TWL XL LVL3 (GOWN DISPOSABLE) ×3 IMPLANT
KIT BASIN OR (CUSTOM PROCEDURE TRAY) ×2 IMPLANT
LEGGING LITHOTOMY PAIR STRL (DRAPES) ×2 IMPLANT
NEEDLE HYPO 25X1 1.5 SAFETY (NEEDLE) ×2 IMPLANT
NS IRRIG 1000ML POUR BTL (IV SOLUTION) ×2 IMPLANT
PACK BASIC VI WITH GOWN DISP (CUSTOM PROCEDURE TRAY) IMPLANT
PACK GENERAL/GYN (CUSTOM PROCEDURE TRAY) ×2 IMPLANT
PENCIL BUTTON HOLSTER BLD 10FT (ELECTRODE) IMPLANT
SPONGE GAUZE 4X4 12PLY (GAUZE/BANDAGES/DRESSINGS) ×2 IMPLANT
SPONGE LAP 18X18 X RAY DECT (DISPOSABLE) ×2 IMPLANT
STRIP CLOSURE SKIN 1/2X4 (GAUZE/BANDAGES/DRESSINGS) ×2 IMPLANT
SUT ETHILON 4 0 PS 2 18 (SUTURE) ×4 IMPLANT
SUT MNCRL AB 4-0 PS2 18 (SUTURE) ×2 IMPLANT
SUT PROLENE 2 0 SH DA (SUTURE) IMPLANT
SUT SILK 2 0 SH (SUTURE) IMPLANT
SUT SILK 3 0 (SUTURE)
SUT SILK 3-0 18XBRD TIE 12 (SUTURE) IMPLANT
SUT VIC AB 2-0 SH 27 (SUTURE)
SUT VIC AB 2-0 SH 27X BRD (SUTURE) IMPLANT
SUT VIC AB 3-0 SH 27 (SUTURE)
SUT VIC AB 3-0 SH 27XBRD (SUTURE) IMPLANT
SYR BULB IRRIGATION 50ML (SYRINGE) IMPLANT
SYR CONTROL 10ML LL (SYRINGE) IMPLANT
TAPE CLOTH SURG 4X10 WHT LF (GAUZE/BANDAGES/DRESSINGS) ×2 IMPLANT
TOWEL OR 17X26 10 PK STRL BLUE (TOWEL DISPOSABLE) ×2 IMPLANT
YANKAUER SUCT BULB TIP 10FT TU (MISCELLANEOUS) ×2 IMPLANT

## 2013-07-12 NOTE — Interval H&P Note (Signed)
History and Physical Interval Note:  07/12/2013 7:33 AM  Kristi Robertson  has presented today for surgery, with the diagnosis of hidradenitis left groin  The various methods of treatment have been discussed with the patient and family. After consideration of risks, benefits and other options for treatment, the patient has consented to  Procedure(s): EXCISION HIDRADENITIS GROIN (N/A) as a surgical intervention .  The patient's history has been reviewed, patient examined, no change in status, stable for surgery.  I have reviewed the patient's chart and labs.  Questions were answered to the patient's satisfaction.     TOTH III,PAUL S

## 2013-07-12 NOTE — Progress Notes (Signed)
Patient has stopped scratching- face less flushed.

## 2013-07-12 NOTE — Progress Notes (Signed)
Patient scratching entire head- no whelps noted- face is flushed- Dr. Rica MastFortune notified-Benadryl 25mg  IVP given as ordered.

## 2013-07-12 NOTE — Op Note (Signed)
07/12/2013  8:21 AM  PATIENT:  Kristi Robertson  28 y.o. female  PRE-OPERATIVE DIAGNOSIS:  hidradenitis left groin  POST-OPERATIVE DIAGNOSIS:  hidradenitis left groin  PROCEDURE:  Procedure(s): EXCISION HIDRADENITIS GROIN (N/A)  SURGEON:  Surgeon(s) and Role:    * Robyne AskewPaul S Toth III, MD - Primary  PHYSICIAN ASSISTANT:   ASSISTANTS: none   ANESTHESIA:   general  EBL:  Total I/O In: -  Out: 10 [Blood:10]  BLOOD ADMINISTERED:none  DRAINS: none   LOCAL MEDICATIONS USED:  MARCAINE     SPECIMEN:  Source of Specimen:  left groin hidradenitis  DISPOSITION OF SPECIMEN:  PATHOLOGY  COUNTS:  YES  TOURNIQUET:  * No tourniquets in log *  DICTATION: .Dragon Dictation After informed consent was obtained the patient was brought to the operating room and placed in the supine position on the operating room table. After adequate induction of general anesthesia the patient was moved into a frog-leg type position and her left groin area was prepped with Betadine and draped in usual sterile manner. The area around the hidradenitis in the left groin was infiltrated with quarter percent Marcaine with epinephrine. An elliptical incision was made with a 10 blade knife around the area involved. The area was excised sharply with a 15 blade knife down into the subcutaneous fat. The remaining skin and subcutaneous tissues tissue looked normal. Hemostasis was achieved using Bovie electrocautery. The wound was then closed with multiple interrupted 4-0 nylon stitches. Antibiotic ointment and sterile dressings were applied. The patient tolerated the procedure well. At the end of the case all needle sponge and instrument counts were correct. The patient was then awakened and taken to recovery in stable condition.  PLAN OF CARE: Discharge to home after PACU  PATIENT DISPOSITION:  PACU - hemodynamically stable.   Delay start of Pharmacological VTE agent (>24hrs) due to surgical blood loss or risk of bleeding:  not applicable

## 2013-07-12 NOTE — Anesthesia Postprocedure Evaluation (Signed)
Anesthesia Post Note  Patient: Kristi Robertson  Procedure(s) Performed: Procedure(s) (LRB): EXCISION HIDRADENITIS GROIN (N/A)  Anesthesia type: General  Patient location: PACU  Post pain: Pain level controlled  Post assessment: Post-op Vital signs reviewed  Last Vitals:  Filed Vitals:   07/12/13 0945  BP: 112/72  Pulse: 72  Temp: 36.3 C  Resp: 12    Post vital signs: Reviewed  Level of consciousness: sedated  Complications: No apparent anesthesia complications

## 2013-07-12 NOTE — Transfer of Care (Signed)
Immediate Anesthesia Transfer of Care Note  Patient: Kristi Robertson  Procedure(s) Performed: Procedure(s): EXCISION HIDRADENITIS GROIN (N/A)  Patient Location: PACU  Anesthesia Type:General  Level of Consciousness: awake, alert , oriented and patient cooperative  Airway & Oxygen Therapy: Patient Spontanous Breathing and Patient connected to face mask oxygen  Post-op Assessment: Report given to PACU RN, Post -op Vital signs reviewed and stable and Patient moving all extremities  Post vital signs: Reviewed and stable  Complications: No apparent anesthesia complications

## 2013-07-12 NOTE — Telephone Encounter (Signed)
Patient called in explaining that she was just discharged from the hospital and that Dr. Carolynne Edouardoth sent her home with percocet but did not sign the Rx.  I informed the patient to bring the Rx to our office and we could have our urgent office physician sign the Rx.

## 2013-07-12 NOTE — H&P (View-Only) (Signed)
Patient ID: Kristi Robertson, female   DOB: 06-26-1985, 28 y.o.   MRN: [id number]  Chief Complaint  Patient presents with  . hidradentitis    inner lt thigh    HPI Kristi FurlSunshine Greeson is a 28 y.o. female.  We are asked to see the patient in consultation by Dr. Margarita RanaHedgecock to evaluate her for an abscess in her left groin. The patient is a 28 year old white female who is been having problems with chronic pain and swelling and drainage in her left groin area for the last 4 years. These symptoms occur intermittently. Her current symptoms of been going on for the last 3 weeks. She has some occasional drainage from the area. She denies any fevers. She does have pretty significant pain in that area. The location the vein makes it difficult for her to wear pants. She has had this area excised before by a dermatologist in [place]. She was prescribed minocycline in January but did not take the medicine. HPI  Past Medical History  Diagnosis Date  . Acid reflux     History reviewed. No pertinent past surgical history.  History reviewed. No pertinent family history.  Social History History  Substance Use Topics  . Smoking status: Never Smoker   . Smokeless tobacco: Not on file  . Alcohol Use: Not on file    Allergies  Allergen Reactions  . Bactrim [Sulfamethoxazole-Tmp Ds] Other (See Comments)    Heart racing  . Codeine Nausea And Vomiting  . Demerol [Meperidine] Nausea And Vomiting  . Keflex [Cephalexin] Other (See Comments)    Heart racing  . Sulfa Antibiotics Other (See Comments)    Heart racing    Current Outpatient Prescriptions  Medication Sig Dispense Refill  . dicyclomine (BENTYL) 10 MG capsule       . ibuprofen (ADVIL,MOTRIN) 200 MG tablet Take 400 mg by mouth every 6 (six) hours as needed for headache.       Marland Kitchen. omeprazole (PRILOSEC) 20 MG capsule Take 40 mg by mouth 2 (two) times daily before a meal.       . ondansetron (ZOFRAN ODT) 4 MG disintegrating tablet 4mg  ODT q4 hours prn  nausea/vomit  12 tablet  0  . ranitidine (ZANTAC) 150 MG capsule Take 150 mg by mouth at bedtime.       No current facility-administered medications for this visit.    Review of Systems Review of Systems  Constitutional: Negative.   HENT: Negative.   Eyes: Negative.   Respiratory: Negative.   Cardiovascular: Negative.   Gastrointestinal: Negative.   Endocrine: Negative.   Genitourinary: Negative.   Musculoskeletal: Negative.   Skin: Positive for wound.  Allergic/Immunologic: Negative.   Neurological: Negative.   Hematological: Negative.   Psychiatric/Behavioral: Negative.     Blood pressure 122/70, pulse 80, temperature 98.6 F (37 C), temperature source Oral, resp. rate 16, height 4\' 11"  (1.499 m), weight 106 lb (48.081 kg).  Physical Exam Physical Exam  Constitutional: She is oriented to person, place, and time. She appears well-developed and well-nourished.  HENT:  Head: Normocephalic and atraumatic.  Eyes: Conjunctivae and EOM are normal. Pupils are equal, round, and reactive to light.  Neck: Normal range of motion. Neck supple.  Cardiovascular: Normal rate, regular rhythm and normal heart sounds.   Pulmonary/Chest: Effort normal and breath sounds normal.  Abdominal: Soft. Bowel sounds are normal.  Musculoskeletal: Normal range of motion.  Lymphadenopathy:    She has no cervical adenopathy.  Neurological: She is alert and oriented to person, place,  and time.  Skin: Skin is warm and dry.  There is an area that appears to be hidradenitis in the left groin. There is some purulent drainage which we could express.  Psychiatric: She has a normal mood and affect. Her behavior is normal.    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 In my opinion the only way to keep this area from continuing to drain this to completely excise it back to healthy tissue. We may be able to close the wound  loosely versus leaving it open and changing the dressing until it heals secondarily. I've discussed with her in detail the risks and benefits of the operation to do this as well as some of the technical aspects and she understands and wishes to proceed     Plan    Plan for excision of hidradenitis from the left groin        TOTH III,PAUL S 06/21/2013, 3:25 PM

## 2013-07-12 NOTE — Preoperative (Signed)
Beta Blockers   Reason not to administer Beta Blockers:Not Applicable 

## 2013-07-12 NOTE — Anesthesia Preprocedure Evaluation (Addendum)
Anesthesia Evaluation  Patient identified by MRN, date of birth, ID band Patient awake    Reviewed: Allergy & Precautions, H&P , NPO status , Patient's Chart, lab work & pertinent test results  Airway Mallampati: II TM Distance: >3 FB Neck ROM: Full    Dental  (+) Teeth Intact, Dental Advisory Given   Pulmonary neg pulmonary ROS,  breath sounds clear to auscultation  Pulmonary exam normal       Cardiovascular negative cardio ROS  Rhythm:Regular Rate:Normal     Neuro/Psych Anxiety negative neurological ROS  negative psych ROS   GI/Hepatic Neg liver ROS, GERD-  Medicated,  Endo/Other  negative endocrine ROS  Renal/GU negative Renal ROS  negative genitourinary   Musculoskeletal negative musculoskeletal ROS (+)   Abdominal   Peds negative pediatric ROS (+)  Hematology negative hematology ROS (+)   Anesthesia Other Findings   Reproductive/Obstetrics negative OB ROS                          Anesthesia Physical Anesthesia Plan  ASA: I  Anesthesia Plan: General   Post-op Pain Management:    Induction: Intravenous  Airway Management Planned: LMA  Additional Equipment:   Intra-op Plan:   Post-operative Plan: Extubation in OR  Informed Consent: I have reviewed the patients History and Physical, chart, labs and discussed the procedure including the risks, benefits and alternatives for the proposed anesthesia with the patient or authorized representative who has indicated his/her understanding and acceptance.   Dental advisory given  Plan Discussed with: CRNA  Anesthesia Plan Comments:         Anesthesia Quick Evaluation

## 2013-07-15 ENCOUNTER — Encounter (HOSPITAL_COMMUNITY): Payer: Self-pay | Admitting: General Surgery

## 2013-07-17 ENCOUNTER — Telehealth (INDEPENDENT_AMBULATORY_CARE_PROVIDER_SITE_OTHER): Payer: Self-pay | Admitting: General Surgery

## 2013-07-17 NOTE — Telephone Encounter (Signed)
Pt is POD 5 from surgery and complaining of severe dizziness and feeling light-headed.  Had migraine three days ago.  States she is drinking fluids well, both water and Gatorade.  No fever or signs of infection; has stopped all her pain medications at the time.  Admits to having trouble with allergies/ pollens.  Reassured pt this is not an expected complication of her surgery or anesthesia.  She will call her PCP if the dizziness persists.

## 2013-07-23 ENCOUNTER — Ambulatory Visit (INDEPENDENT_AMBULATORY_CARE_PROVIDER_SITE_OTHER): Payer: 59 | Admitting: General Surgery

## 2013-07-23 ENCOUNTER — Encounter (INDEPENDENT_AMBULATORY_CARE_PROVIDER_SITE_OTHER): Payer: Self-pay | Admitting: General Surgery

## 2013-07-23 ENCOUNTER — Encounter (INDEPENDENT_AMBULATORY_CARE_PROVIDER_SITE_OTHER): Payer: Self-pay

## 2013-07-23 VITALS — BP 112/76 | HR 74 | Temp 97.5°F | Ht 59.0 in | Wt 106.0 lb

## 2013-07-23 DIAGNOSIS — L732 Hidradenitis suppurativa: Secondary | ICD-10-CM

## 2013-07-23 NOTE — Progress Notes (Signed)
Subjective:     Patient ID: Kristi Robertson, female   DOB: 02/22/1986, 28 y.o.   MRN: [id number]  HPI The patient is a 28 year old white female who is about 10 days status post excision of hidradenitis from her left groin area. She has done well. She has no complaints today. She has had no discharge from the area. She denies any pain in the area.  Review of Systems     Objective:   Physical Exam On exam the incision appears to be healing nicely with no sign of infection. The nylon stitches were removed today and she tolerated this well.    Assessment:     The patient is about 10 days status post excision of hidradenitis from her left groin     Plan:     At this point she will continue to keep the incision clean and dry. I will plan to see her back in about one month to check her progress. She may return to her normal activities in about a week.

## 2013-07-23 NOTE — Patient Instructions (Signed)
Keep incision clean and dry.

## 2013-08-23 ENCOUNTER — Ambulatory Visit (INDEPENDENT_AMBULATORY_CARE_PROVIDER_SITE_OTHER): Payer: 59 | Admitting: General Surgery

## 2013-08-23 ENCOUNTER — Encounter (INDEPENDENT_AMBULATORY_CARE_PROVIDER_SITE_OTHER): Payer: Self-pay | Admitting: General Surgery

## 2013-08-23 VITALS — BP 110/80 | HR 80 | Temp 97.6°F | Resp 14 | Wt 106.6 lb

## 2013-08-23 DIAGNOSIS — L732 Hidradenitis suppurativa: Secondary | ICD-10-CM

## 2013-08-23 MED ORDER — DOXYCYCLINE HYCLATE 100 MG PO TABS: 100.0000 mg | ORAL_TABLET | Freq: Two times a day (BID) | ORAL | Status: DC

## 2013-08-23 NOTE — Patient Instructions (Signed)
Call if the right side does not resolve and you want it removed

## 2013-08-23 NOTE — Progress Notes (Signed)
Subjective:     Patient ID: Kristi Robertson, female   DOB: 1985-10-17, 28 y.o.   MRN: [id number]  HPI The patient is a 28 year old white female who is just over a month out from excision of hidradenitis from her left groin area. She has done well and has no complaints today. She has been able to run without pain and she is very happy about this.  Review of Systems     Objective:   Physical Exam On exam the incision in the left groin has healed nicely with no sign of infection.    Assessment:     The patient is just over a month out from excision of hidradenitis from the left groin     Plan:     At this point she may return to her normal activities without restriction. She also has a smaller area in the right groin and that this does not resolve on antibiotics and she will call us back when she wants to have it also excised.

## 2013-11-21 ENCOUNTER — Inpatient Hospital Stay (HOSPITAL_COMMUNITY)
Admission: AD | Admit: 2013-11-21 | Discharge: 2013-11-21 | Disposition: A | Discharge status: Home / Self Care | Payer: 59 | Source: Ambulatory Visit | Attending: Obstetrics & Gynecology | Admitting: Obstetrics & Gynecology

## 2013-11-21 ENCOUNTER — Inpatient Hospital Stay (HOSPITAL_COMMUNITY): Payer: 59

## 2013-11-21 ENCOUNTER — Encounter (HOSPITAL_COMMUNITY): Payer: Self-pay | Admitting: *Deleted

## 2013-11-21 DIAGNOSIS — R109 Unspecified abdominal pain: Secondary | ICD-10-CM | POA: Diagnosis not present

## 2013-11-21 DIAGNOSIS — Z90710 Acquired absence of both cervix and uterus: Secondary | ICD-10-CM | POA: Insufficient documentation

## 2013-11-21 DIAGNOSIS — K219 Gastro-esophageal reflux disease without esophagitis: Secondary | ICD-10-CM | POA: Diagnosis not present

## 2013-11-21 DIAGNOSIS — N83209 Unspecified ovarian cyst, unspecified side: Secondary | ICD-10-CM | POA: Diagnosis not present

## 2013-11-21 DIAGNOSIS — F411 Generalized anxiety disorder: Secondary | ICD-10-CM | POA: Insufficient documentation

## 2013-11-21 HISTORY — DX: Unspecified ovarian cyst, unspecified side: N83.209

## 2013-11-21 LAB — CBC
HCT: 38.5 % (ref 36.0–46.0)
Hemoglobin: 13.4 g/dL (ref 12.0–15.0)
MCH: 30.5 pg (ref 26.0–34.0)
MCHC: 34.8 g/dL (ref 30.0–36.0)
MCV: 87.7 fL (ref 78.0–100.0)
Platelets: 228 10*3/uL (ref 150–400)
RBC: 4.39 MIL/uL (ref 3.87–5.11)
RDW: 12.6 % (ref 11.5–15.5)
WBC: 8.1 10*3/uL (ref 4.0–10.5)

## 2013-11-21 LAB — URINALYSIS, ROUTINE W REFLEX MICROSCOPIC
Bilirubin Urine: NEGATIVE
Glucose, UA: NEGATIVE mg/dL
Hgb urine dipstick: NEGATIVE
Ketones, ur: NEGATIVE mg/dL
LEUKOCYTES UA: NEGATIVE
NITRITE: NEGATIVE
Protein, ur: NEGATIVE mg/dL
Urobilinogen, UA: 0.2 mg/dL (ref 0.0–1.0)
pH: 6 (ref 5.0–8.0)

## 2013-11-21 LAB — WET PREP, GENITAL
Clue Cells Wet Prep HPF POC: NONE SEEN
TRICH WET PREP: NONE SEEN
WBC, Wet Prep HPF POC: NONE SEEN
Yeast Wet Prep HPF POC: NONE SEEN

## 2013-11-21 MED ORDER — IBUPROFEN 800 MG PO TABS
800.0000 mg | ORAL_TABLET | Freq: Three times a day (TID) | ORAL | Status: DC
Start: 2013-11-21 — End: 2014-03-19

## 2013-11-21 MED ORDER — KETOROLAC TROMETHAMINE 60 MG/2ML IM SOLN
60.0000 mg | Freq: Once | INTRAMUSCULAR | Status: AC
  Administered 2013-11-21: 60 mg via INTRAMUSCULAR
  Filled 2013-11-21: qty 2

## 2013-11-21 MED ORDER — ONDANSETRON 8 MG PO TBDP
8.0000 mg | ORAL_TABLET | Freq: Once | ORAL | Status: AC
  Administered 2013-11-21: 8 mg via ORAL
  Filled 2013-11-21: qty 1

## 2013-11-21 MED ORDER — ONDANSETRON HCL 4 MG PO TABS: 4.0000 mg | ORAL_TABLET | Freq: Four times a day (QID) | ORAL | Status: DC

## 2013-11-21 NOTE — Discharge Instructions (Signed)
Abdominal Pain, Women °Abdominal (stomach, pelvic, or belly) pain can be caused by many things. It is important to tell your doctor: °· The location of the pain. °· Does it come and go or is it present all the time? °· Are there things that start the pain (eating certain foods, exercise)? °· Are there other symptoms associated with the pain (fever, nausea, vomiting, diarrhea)? °All of this is helpful to know when trying to find the cause of the pain. °CAUSES  °· Stomach: virus or bacteria infection, or ulcer. °· Intestine: appendicitis (inflamed appendix), regional ileitis (Crohn's disease), ulcerative colitis (inflamed colon), irritable bowel syndrome, diverticulitis (inflamed diverticulum of the colon), or cancer of the stomach or intestine. °· Gallbladder disease or stones in the gallbladder. °· Kidney disease, kidney stones, or infection. °· Pancreas infection or cancer. °· Fibromyalgia (pain disorder). °· Diseases of the female organs: °¨ Uterus: fibroid (non-cancerous) tumors or infection. °¨ Fallopian tubes: infection or tubal pregnancy. °¨ Ovary: cysts or tumors. °¨ Pelvic adhesions (scar tissue). °¨ Endometriosis (uterus lining tissue growing in the pelvis and on the pelvic organs). °¨ Pelvic congestion syndrome (female organs filling up with blood just before the menstrual period). °¨ Pain with the menstrual period. °¨ Pain with ovulation (producing an egg). °¨ Pain with an IUD (intrauterine device, birth control) in the uterus. °¨ Cancer of the female organs. °· Functional pain (pain not caused by a disease, may improve without treatment). °· Psychological pain. °· Depression. °DIAGNOSIS  °Your doctor will decide the seriousness of your pain by doing an examination. °· Blood tests. °· X-rays. °· Ultrasound. °· CT scan (computed tomography, special type of X-ray). °· MRI (magnetic resonance imaging). °· Cultures, for infection. °· Barium enema (dye inserted in the large intestine, to better view it with  X-rays). °· Colonoscopy (looking in intestine with a lighted tube). °· Laparoscopy (minor surgery, looking in abdomen with a lighted tube). °· Major abdominal exploratory surgery (looking in abdomen with a large incision). °TREATMENT  °The treatment will depend on the cause of the pain.  °· Many cases can be observed and treated at home. °· Over-the-counter medicines recommended by your caregiver. °· Prescription medicine. °· Antibiotics, for infection. °· Birth control pills, for painful periods or for ovulation pain. °· Hormone treatment, for endometriosis. °· Nerve blocking injections. °· Physical therapy. °· Antidepressants. °· Counseling with a psychologist or psychiatrist. °· Minor or major surgery. °HOME CARE INSTRUCTIONS  °· Do not take laxatives, unless directed by your caregiver. °· Take over-the-counter pain medicine only if ordered by your caregiver. Do not take aspirin because it can cause an upset stomach or bleeding. °· Try a clear liquid diet (broth or water) as ordered by your caregiver. Slowly move to a bland diet, as tolerated, if the pain is related to the stomach or intestine. °· Have a thermometer and take your temperature several times a day, and record it. °· Bed rest and sleep, if it helps the pain. °· Avoid sexual intercourse, if it causes pain. °· Avoid stressful situations. °· Keep your follow-up appointments and tests, as your caregiver orders. °· If the pain does not go away with medicine or surgery, you may try: °¨ Acupuncture. °¨ Relaxation exercises (yoga, meditation). °¨ Group therapy. °¨ Counseling. °SEEK MEDICAL CARE IF:  °· You notice certain foods cause stomach pain. °· Your home care treatment is not helping your pain. °· You need stronger pain medicine. °· You want your IUD removed. °· You feel faint or   lightheaded. °· You develop nausea and vomiting. °· You develop a rash. °· You are having side effects or an allergy to your medicine. °SEEK IMMEDIATE MEDICAL CARE IF:  °· Your  pain does not go away or gets worse. °· You have a fever. °· Your pain is felt only in portions of the abdomen. The right side could possibly be appendicitis. The left lower portion of the abdomen could be colitis or diverticulitis. °· You are passing blood in your stools (bright red or black tarry stools, with or without vomiting). °· You have blood in your urine. °· You develop chills, with or without a fever. °· You pass out. °MAKE SURE YOU:  °· Understand these instructions. °· Will watch your condition. °· Will get help right away if you are not doing well or get worse. °Document Released: 01/30/2007 Document Revised: 08/19/2013 Document Reviewed: 02/19/2009 °Exit[employer]® Patient Information ©2015 [employer]. This information is not intended to replace advice given to you by your health care provider. Make sure you discuss any questions you have with your health care provider. ° °

## 2013-11-21 NOTE — MAU Provider Note (Signed)
History     CSN: [id number]  Arrival date and time: 11/21/13 1225   None     Chief Complaint  Patient presents with  . Abdominal Pain  . Nausea   HPI  Kristi Robertson 28 y.o. presenting to MAU for abdominal pain x 5 days. Initially the pain was sharp and radiating from R abdomen across to left. Today has been having 6/10 cramping right sided abdominal pain. She has had nausea but no vomiting. No vaginal bleeding or discharge. Hx of a hysterectomy for menorrhagia in 2013 and her right ovary was removed after having multiple cysts in 2010.  She was seen at [facility] for the same on 8/3 where she reports she had a abdominal CT showing ovarian cysts on the left. She subsequently had an US on Tuesday at showing multiple ovarian cysts. She reports taking ibuprofen and tylenol at home which have been providing moderate relief. Pt has had 3 pelvic surgeries in [place]- pt moved to [place] about 1 year ago.   Garvin Filaonna C Coley, RN Registered Nurse Signed Nursing MAU Note Service date: 11/21/2013 1:31 PM   Patient states she has recently been diagnosed with ovarian cysts. Has had a hysterectomy in 2013. States the pain is getting worse and moving to the left side. Denies bleeding or discharge. Has some nausea.        Past Medical History  Diagnosis Date  . Acid reflux   . Anxiety   . PONV (postoperative nausea and vomiting)     severe  . Ovarian cyst     left side    Past Surgical History  Procedure Laterality Date  . Cyst removed from ovary Right july 2010  . Fallopian tube and ovary removed  july 2011  . Abdominal hysterectomy  October 20, 2011  . Excision to groin Left     I and D multiple times  . Inguinal hernia repair N/A 07/12/2013    Procedure: EXCISION HIDRADENITIS GROIN;  Surgeon: Robyne AskewPaul S Toth III, MD;  Location: [facility];  Service: General;  Laterality: N/A;    History reviewed. No pertinent family history.  History  Substance Use Topics  . Smoking status: Never  Smoker   . Smokeless tobacco: Never Used  . Alcohol Use: Yes     Comment: occasional    Allergies:  Allergies  Allergen Reactions  . Bactrim [Sulfamethoxazole-Tmp Ds] Other (See Comments)    Heart racing  . Codeine Nausea And Vomiting  . Demerol [Meperidine] Nausea And Vomiting  . Keflex [Cephalexin] Other (See Comments)    Heart racing  . Sulfa Antibiotics Other (See Comments)    Heart racing    Prescriptions prior to admission  Medication Sig Dispense Refill  . acetaminophen (TYLENOL) 500 MG tablet Take 1,000 mg by mouth every 6 (six) hours as needed for mild pain or moderate pain.      Marland Kitchen. ibuprofen (ADVIL,MOTRIN) 200 MG tablet Take 400 mg by mouth every 6 (six) hours as needed for headache.       Marland Kitchen. omeprazole (PRILOSEC) 20 MG capsule Take 40 mg by mouth 2 (two) times daily before a meal.         Review of Systems  Constitutional: Negative.   HENT: Negative.   Respiratory: Negative.   Cardiovascular: Negative.   Gastrointestinal: Positive for nausea and abdominal pain. Negative for vomiting, diarrhea and constipation.  Genitourinary: Negative.    Physical Exam   Blood pressure 114/88, pulse 90, temperature 98.4 F (36.9  C), temperature source Oral, resp. rate 16, height 4\' 10"  (1.473 m), weight 109 lb 3.2 oz (49.533 kg), SpO2 99.00%.  Physical Exam  Constitutional: She is oriented to person, place, and time. She appears well-developed and well-nourished.  HENT:  Head: Normocephalic.  Cardiovascular: Normal rate, regular rhythm and normal heart sounds.   Respiratory: Effort normal and breath sounds normal.  GI: Soft. There is tenderness in the suprapubic area and left lower quadrant.  Neurological: She is alert and oriented to person, place, and time.  Skin: Skin is warm and dry.  Psychiatric: She has a normal mood and affect.    MAU Course  Procedures  MDM: Cbc Urinalysis Pelvic US US Transvaginal Non-ob  11/21/2013   CLINICAL DATA:  Pelvic pain.  EXAM:  TRANSABDOMINAL ULTRASOUND OF PELVIS  TECHNIQUE: Transabdominal ultrasound examination of the pelvis was performed including evaluation of the uterus, ovaries, adnexal regions, and pelvic cul-de-sac.  COMPARISON:  Pelvic ultrasound 11/19/2013.  FINDINGS: Uterus  Status post hysterectomy.  Right ovary  Status post right oophorectomy.  Left ovary  Measurements: 4.4 x 2.0 x 3.9 cm. Multiple small anechoic structures with increased through transmission compatible with multiple follicles.  Other findings: Trace volume of free fluid in the cul-de-sac, presumably physiologic.  IMPRESSION: 1. Trace volume of free fluid in the cul-de-sac, presumably physiologic in this young female patient. 2. Status post hysterectomy and right oophorectomy. 3. Normal appearance of the left ovary.   Electronically Signed   By: Trudie Reed M.D.   On: 11/21/2013 17:42   US Pelvis Complete  11/21/2013   CLINICAL DATA:  Pelvic pain.  EXAM: TRANSABDOMINAL ULTRASOUND OF PELVIS  TECHNIQUE: Transabdominal ultrasound examination of the pelvis was performed including evaluation of the uterus, ovaries, adnexal regions, and pelvic cul-de-sac.  COMPARISON:  Pelvic ultrasound 11/19/2013.  FINDINGS: Uterus  Status post hysterectomy.  Right ovary  Status post right oophorectomy.  Left ovary  Measurements: 4.4 x 2.0 x 3.9 cm. Multiple small anechoic structures with increased through transmission compatible with multiple follicles.  Other findings: Trace volume of free fluid in the cul-de-sac, presumably physiologic.  IMPRESSION: 1. Trace volume of free fluid in the cul-de-sac, presumably physiologic in this young female patient. 2. Status post hysterectomy and right oophorectomy. 3. Normal appearance of the left ovary.   Electronically Signed   By: Trudie Reed M.D.   On: 11/21/2013 17:42    Assessment and Plan  Abdominal pain Ovarian cyst- Ibuprofen 800mg  and Zofran 8mg  ODT F/u with physician in [place]- return to MAU if increase in  pain Recommend physical therapy for scar tissue  LINEBERRY,SUSAN 11/21/2013, 4:05 PM

## 2013-11-21 NOTE — MAU Note (Signed)
Patient states she has recently been diagnosed with ovarian cysts. Has had a hysterectomy in 2013. States the pain is getting worse and moving to the left side. Denies bleeding or discharge. Has some nausea.

## 2013-11-22 LAB — GC/CHLAMYDIA PROBE AMP
CT PROBE, AMP APTIMA: NEGATIVE
GC Probe RNA: NEGATIVE

## 2014-02-12 ENCOUNTER — Other Ambulatory Visit (INDEPENDENT_AMBULATORY_CARE_PROVIDER_SITE_OTHER): Payer: Self-pay | Admitting: General Surgery

## 2014-02-17 ENCOUNTER — Encounter (HOSPITAL_COMMUNITY): Payer: Self-pay | Admitting: *Deleted

## 2014-03-19 ENCOUNTER — Encounter (HOSPITAL_COMMUNITY): Payer: Self-pay | Admitting: Pharmacy Technician

## 2014-03-20 ENCOUNTER — Encounter (HOSPITAL_COMMUNITY)
Admission: RE | Admit: 2014-03-20 | Discharge: 2014-03-20 | Disposition: A | Discharge status: Home / Self Care | Payer: 59 | Source: Ambulatory Visit | Attending: General Surgery | Admitting: General Surgery

## 2014-03-20 ENCOUNTER — Encounter (HOSPITAL_COMMUNITY): Payer: Self-pay

## 2014-03-20 DIAGNOSIS — Z01812 Encounter for preprocedural laboratory examination: Secondary | ICD-10-CM | POA: Insufficient documentation

## 2014-03-20 HISTORY — DX: Presence of spectacles and contact lenses: Z97.3

## 2014-03-20 HISTORY — DX: Headache, unspecified: R51.9

## 2014-03-20 HISTORY — DX: Headache: R51

## 2014-03-20 LAB — COMPREHENSIVE METABOLIC PANEL
ALBUMIN: 4.1 g/dL (ref 3.5–5.2)
ALK PHOS: 46 U/L (ref 39–117)
ALT: 15 U/L (ref 0–35)
AST: 18 U/L (ref 0–37)
Anion gap: 11 (ref 5–15)
BUN: 9 mg/dL (ref 6–23)
CO2: 25 mEq/L (ref 19–32)
Calcium: 9.3 mg/dL (ref 8.4–10.5)
Chloride: 104 mEq/L (ref 96–112)
Creatinine, Ser: 0.68 mg/dL (ref 0.50–1.10)
GFR calc non Af Amer: >90 mL/min (ref >90)
GLUCOSE: 82 mg/dL (ref 70–99)
POTASSIUM: 4.4 meq/L (ref 3.7–5.3)
SODIUM: 140 meq/L (ref 137–147)
Total Bilirubin: 0.5 mg/dL (ref 0.3–1.2)
Total Protein: 7.4 g/dL (ref 6.0–8.3)

## 2014-03-20 LAB — CBC
HEMATOCRIT: 38.8 % (ref 36.0–46.0)
Hemoglobin: 13.4 g/dL (ref 12.0–15.0)
MCH: 29.6 pg (ref 26.0–34.0)
MCHC: 34.5 g/dL (ref 30.0–36.0)
MCV: 85.8 fL (ref 78.0–100.0)
Platelets: 272 10*3/uL (ref 150–400)
RBC: 4.52 MIL/uL (ref 3.87–5.11)
RDW: 12.7 % (ref 11.5–15.5)
WBC: 5.6 10*3/uL (ref 4.0–10.5)

## 2014-03-20 NOTE — Pre-Procedure Instructions (Signed)
Kristi Robertson  03/20/2014   Your procedure is scheduled on: Thursday, March 27, 2014  [facility]:30 AM.  Call this number if you have problems the morning of surgery: [phone]   Remember:   Do not eat food or drink liquids after midnight Wednesday, March 26, 2014   Take these medicines the morning of surgery with A SIP OF WATER: omeprazole (PRILOSEC), ondansetron (ZOFRAN),  if needed: acetaminophen (TYLENOL) for pain  Stop taking Aspirin, vitamins, and herbal medications. Do not take any NSAIDs ie: Ibuprofen, Advil, Naproxen or any medication containing Aspirin; stop 5 days prior to procedure (Saturday, March 22, 2014).   Do not wear jewelry, make-up or nail polish.  Do not wear lotions, powders, or perfumes. You may not wear deodorant.  Do not shave 48 hours prior to surgery.   Do not bring valuables to the hospital.  [facility] any belongings or valuables.               Contacts, dentures or bridgework may not be worn into surgery.  Leave suitcase in the car. After surgery it may be brought to your room.  For patients admitted to the hospital, discharge time is determined by your treatment team.               Patients discharged the day of surgery will not be allowed to drive home.  Name and phone number of your driver:   Special Instructions:  Special Instructions:Special Instructions: [facility] - Preparing for [facility]  Before surgery, you can play an important role.  Because skin is not sterile, your skin needs to be as free of germs as possible.  You can reduce the number of germs on you skin by washing with CHG (chlorahexidine gluconate) soap before surgery.  CHG is an antiseptic cleaner which kills germs and bonds with the skin to continue killing germs even after washing.  Please DO NOT use if you have an allergy to CHG or antibacterial soaps.  If your skin becomes reddened/irritated stop using the CHG  and inform your nurse when you arrive at Short Stay.  Do not shave (including legs and underarms) for at least 48 hours prior to the first CHG shower.  You may shave your face.  Please follow these instructions carefully:   1.  Shower with CHG Soap the night before surgery and the morning of Surgery.  2.  If you choose to wash your hair, wash your hair first as usual with your normal shampoo.  3.  After you shampoo, rinse your hair and body thoroughly to remove the Shampoo.  4.  Use CHG as you would any other liquid soap.  You can apply chg directly  to the skin and wash gently with scrungie or a clean washcloth.  5.  Apply the CHG Soap to your body ONLY FROM THE NECK DOWN.  Do not use on open wounds or open sores.  Avoid contact with your eyes, ears, mouth and genitals (private parts).  Wash genitals (private parts) with your normal soap.  6.  Wash thoroughly, paying special attention to the area where your surgery will be performed.  7.  Thoroughly rinse your body with warm water from the neck down.  8.  DO NOT shower/wash with your normal soap after using and rinsing off the CHG Soap.  9.  Pat yourself dry with a clean towel.  10.  Wear clean pajamas.            11.  Place clean sheets on your bed the night of your first shower and do not sleep with pets.  Day of Surgery  Do not apply any lotions/deodorants the morning of surgery.  Please wear clean clothes to the hospital/surgery center.   Please read over the following fact sheets that you were given: Pain Booklet, Coughing and Deep Breathing and Surgical Site Infection Prevention

## 2014-03-20 NOTE — Progress Notes (Signed)
Pt denies SOB, chest pain, and being under the care of a cardiologist. Pt denies having a stress test, echo, and cardiac cath. 

## 2014-03-26 MED ORDER — CHLORHEXIDINE GLUCONATE 4 % EX LIQD
1.0000 "application " | Freq: Once | CUTANEOUS | Status: DC
  Filled 2014-03-26: qty 15

## 2014-03-26 MED ORDER — CIPROFLOXACIN IN D5W 400 MG/200ML IV SOLN
400.0000 mg | INTRAVENOUS | Status: AC
  Administered 2014-03-27: 400 mg via INTRAVENOUS
  Filled 2014-03-26: qty 200

## 2014-03-27 ENCOUNTER — Ambulatory Visit (HOSPITAL_COMMUNITY): Payer: 59 | Admitting: Vascular Surgery

## 2014-03-27 ENCOUNTER — Encounter (HOSPITAL_COMMUNITY)
Admission: RE | Disposition: A | Discharge status: Home / Self Care | Payer: Self-pay | Source: Ambulatory Visit | Attending: General Surgery

## 2014-03-27 ENCOUNTER — Ambulatory Visit (HOSPITAL_COMMUNITY): Payer: 59

## 2014-03-27 ENCOUNTER — Ambulatory Visit (HOSPITAL_COMMUNITY): Payer: 59 | Admitting: Anesthesiology

## 2014-03-27 ENCOUNTER — Encounter (HOSPITAL_COMMUNITY): Payer: Self-pay | Admitting: Anesthesiology

## 2014-03-27 ENCOUNTER — Ambulatory Visit (HOSPITAL_COMMUNITY)
Admission: RE | Admit: 2014-03-27 | Discharge: 2014-03-27 | Disposition: A | Discharge status: Home / Self Care | Payer: 59 | Source: Ambulatory Visit | Attending: General Surgery | Admitting: General Surgery

## 2014-03-27 DIAGNOSIS — K219 Gastro-esophageal reflux disease without esophagitis: Secondary | ICD-10-CM | POA: Diagnosis not present

## 2014-03-27 DIAGNOSIS — K8050 Calculus of bile duct without cholangitis or cholecystitis without obstruction: Secondary | ICD-10-CM | POA: Diagnosis not present

## 2014-03-27 DIAGNOSIS — G43909 Migraine, unspecified, not intractable, without status migrainosus: Secondary | ICD-10-CM | POA: Insufficient documentation

## 2014-03-27 DIAGNOSIS — E780 Pure hypercholesterolemia: Secondary | ICD-10-CM | POA: Diagnosis not present

## 2014-03-27 DIAGNOSIS — Z419 Encounter for procedure for purposes other than remedying health state, unspecified: Secondary | ICD-10-CM

## 2014-03-27 HISTORY — PX: CHOLECYSTECTOMY: SHX55

## 2014-03-27 SURGERY — LAPAROSCOPIC CHOLECYSTECTOMY WITH INTRAOPERATIVE CHOLANGIOGRAM
Anesthesia: General | Site: Abdomen

## 2014-03-27 MED ORDER — SODIUM CHLORIDE 0.9 % IR SOLN
Status: DC | PRN
  Administered 2014-03-27: 1000 mL

## 2014-03-27 MED ORDER — ROCURONIUM BROMIDE 50 MG/5ML IV SOLN
INTRAVENOUS | Status: AC
  Filled 2014-03-27: qty 1

## 2014-03-27 MED ORDER — DEXAMETHASONE SODIUM PHOSPHATE 10 MG/ML IJ SOLN
INTRAMUSCULAR | Status: DC | PRN
  Administered 2014-03-27: 10 mg via INTRAVENOUS

## 2014-03-27 MED ORDER — HYDROMORPHONE HCL 1 MG/ML IJ SOLN
0.2500 mg | INTRAMUSCULAR | Status: DC | PRN
  Administered 2014-03-27 (×3): 0.5 mg via INTRAVENOUS

## 2014-03-27 MED ORDER — ONDANSETRON 4 MG PO TBDP: 4.0000 mg | ORAL_TABLET | Freq: Three times a day (TID) | ORAL | Status: DC | PRN

## 2014-03-27 MED ORDER — 0.9 % SODIUM CHLORIDE (POUR BTL) OPTIME
TOPICAL | Status: DC | PRN
  Administered 2014-03-27: 1000 mL

## 2014-03-27 MED ORDER — ONDANSETRON HCL 4 MG/2ML IJ SOLN
INTRAMUSCULAR | Status: AC
  Filled 2014-03-27: qty 2

## 2014-03-27 MED ORDER — NEOSTIGMINE METHYLSULFATE 10 MG/10ML IV SOLN
INTRAVENOUS | Status: AC
  Filled 2014-03-27: qty 1

## 2014-03-27 MED ORDER — METOCLOPRAMIDE HCL 5 MG/ML IJ SOLN: 10.0000 mg | Freq: Four times a day (QID) | INTRAMUSCULAR | Status: DC | PRN

## 2014-03-27 MED ORDER — ONDANSETRON HCL 4 MG/2ML IJ SOLN
INTRAMUSCULAR | Status: DC | PRN
  Administered 2014-03-27: 4 mg via INTRAVENOUS

## 2014-03-27 MED ORDER — SODIUM CHLORIDE 0.9 % IJ SOLN
INTRAMUSCULAR | Status: AC
  Filled 2014-03-27: qty 10

## 2014-03-27 MED ORDER — SUCCINYLCHOLINE CHLORIDE 20 MG/ML IJ SOLN
INTRAMUSCULAR | Status: AC
  Filled 2014-03-27: qty 1

## 2014-03-27 MED ORDER — FENTANYL CITRATE 0.05 MG/ML IJ SOLN
INTRAMUSCULAR | Status: AC
  Filled 2014-03-27: qty 5

## 2014-03-27 MED ORDER — PROPOFOL 10 MG/ML IV BOLUS
INTRAVENOUS | Status: AC
  Filled 2014-03-27: qty 20

## 2014-03-27 MED ORDER — MIDAZOLAM HCL 5 MG/5ML IJ SOLN
INTRAMUSCULAR | Status: DC | PRN
  Administered 2014-03-27: 2 mg via INTRAVENOUS

## 2014-03-27 MED ORDER — GLYCOPYRROLATE 0.2 MG/ML IJ SOLN
INTRAMUSCULAR | Status: DC | PRN
  Administered 2014-03-27: .6 mg via INTRAVENOUS

## 2014-03-27 MED ORDER — MIDAZOLAM HCL 2 MG/2ML IJ SOLN
INTRAMUSCULAR | Status: AC
  Filled 2014-03-27: qty 2

## 2014-03-27 MED ORDER — DEXAMETHASONE SODIUM PHOSPHATE 10 MG/ML IJ SOLN
INTRAMUSCULAR | Status: AC
  Filled 2014-03-27: qty 1

## 2014-03-27 MED ORDER — LACTATED RINGERS IV SOLN
INTRAVENOUS | Status: DC | PRN
  Administered 2014-03-27 (×2): via INTRAVENOUS

## 2014-03-27 MED ORDER — OXYCODONE-ACETAMINOPHEN 5-325 MG PO TABS: 1.0000 | ORAL_TABLET | ORAL | Status: DC | PRN

## 2014-03-27 MED ORDER — PROMETHAZINE HCL 25 MG/ML IJ SOLN: 6.2500 mg | INTRAMUSCULAR | Status: DC | PRN

## 2014-03-27 MED ORDER — LIDOCAINE HCL (CARDIAC) 20 MG/ML IV SOLN
INTRAVENOUS | Status: DC | PRN
  Administered 2014-03-27: 100 mg via INTRAVENOUS

## 2014-03-27 MED ORDER — GLYCOPYRROLATE 0.2 MG/ML IJ SOLN
INTRAMUSCULAR | Status: AC
  Filled 2014-03-27: qty 3

## 2014-03-27 MED ORDER — ROCURONIUM BROMIDE 100 MG/10ML IV SOLN
INTRAVENOUS | Status: DC | PRN
  Administered 2014-03-27: 25 mg via INTRAVENOUS

## 2014-03-27 MED ORDER — PROMETHAZINE HCL 25 MG/ML IJ SOLN
INTRAMUSCULAR | Status: AC
  Filled 2014-03-27: qty 1

## 2014-03-27 MED ORDER — NEOSTIGMINE METHYLSULFATE 10 MG/10ML IV SOLN
INTRAVENOUS | Status: DC | PRN
  Administered 2014-03-27: 3 mg via INTRAVENOUS

## 2014-03-27 MED ORDER — HYDROMORPHONE HCL 1 MG/ML IJ SOLN
INTRAMUSCULAR | Status: AC
  Filled 2014-03-27: qty 1

## 2014-03-27 MED ORDER — SUCCINYLCHOLINE CHLORIDE 20 MG/ML IJ SOLN
INTRAMUSCULAR | Status: DC | PRN
  Administered 2014-03-27: 120 mg via INTRAVENOUS

## 2014-03-27 MED ORDER — ARTIFICIAL TEARS OP OINT
TOPICAL_OINTMENT | OPHTHALMIC | Status: AC
  Filled 2014-03-27: qty 3.5

## 2014-03-27 MED ORDER — METOCLOPRAMIDE HCL 5 MG/ML IJ SOLN
INTRAMUSCULAR | Status: AC
  Administered 2014-03-27: 10 mg
  Filled 2014-03-27: qty 2

## 2014-03-27 MED ORDER — FENTANYL CITRATE 0.05 MG/ML IJ SOLN
INTRAMUSCULAR | Status: DC | PRN
  Administered 2014-03-27: 50 ug via INTRAVENOUS
  Administered 2014-03-27: 150 ug via INTRAVENOUS
  Administered 2014-03-27: 50 ug via INTRAVENOUS

## 2014-03-27 MED ORDER — PROPOFOL 10 MG/ML IV BOLUS
INTRAVENOUS | Status: DC | PRN
  Administered 2014-03-27: 150 mg via INTRAVENOUS

## 2014-03-27 MED ORDER — ARTIFICIAL TEARS OP OINT
TOPICAL_OINTMENT | OPHTHALMIC | Status: DC | PRN
  Administered 2014-03-27: 1 via OPHTHALMIC

## 2014-03-27 MED ORDER — METOCLOPRAMIDE HCL 5 MG/ML IJ SOLN
10.0000 mg | Freq: Four times a day (QID) | INTRAMUSCULAR | Status: DC | PRN
  Filled 2014-03-27: qty 2

## 2014-03-27 MED ORDER — SODIUM CHLORIDE 0.9 % IV SOLN
INTRAVENOUS | Status: DC | PRN
  Administered 2014-03-27: 08:00:00

## 2014-03-27 MED ORDER — SCOPOLAMINE 1 MG/3DAYS TD PT72
MEDICATED_PATCH | TRANSDERMAL | Status: AC
  Administered 2014-03-27: 1 via TRANSDERMAL
  Filled 2014-03-27: qty 1

## 2014-03-27 MED ORDER — LIDOCAINE HCL (CARDIAC) 20 MG/ML IV SOLN
INTRAVENOUS | Status: AC
  Filled 2014-03-27: qty 5

## 2014-03-27 MED ORDER — EPHEDRINE SULFATE 50 MG/ML IJ SOLN
INTRAMUSCULAR | Status: AC
  Filled 2014-03-27: qty 1

## 2014-03-27 MED ORDER — BUPIVACAINE-EPINEPHRINE 0.25% -1:200000 IJ SOLN
INTRAMUSCULAR | Status: DC | PRN
  Administered 2014-03-27: 20 mL

## 2014-03-27 MED ORDER — BUPIVACAINE-EPINEPHRINE (PF) 0.25% -1:200000 IJ SOLN
INTRAMUSCULAR | Status: AC
  Filled 2014-03-27: qty 30

## 2014-03-27 SURGICAL SUPPLY — 36 items
APPLIER CLIP 5 13 M/L LIGAMAX5 (MISCELLANEOUS) ×2
CANISTER SUCTION 2500CC (MISCELLANEOUS) ×2 IMPLANT
CATH REDDICK CHOLANGI 4FR 50CM (CATHETERS) ×2 IMPLANT
CHLORAPREP W/TINT 26ML (MISCELLANEOUS) ×2 IMPLANT
CLIP APPLIE 5 13 M/L LIGAMAX5 (MISCELLANEOUS) ×1 IMPLANT
COVER MAYO STAND STRL (DRAPES) ×2 IMPLANT
COVER SURGICAL LIGHT HANDLE (MISCELLANEOUS) ×2 IMPLANT
DRAPE C-ARM 42X72 X-RAY (DRAPES) ×2 IMPLANT
DRAPE LAPAROSCOPIC ABDOMINAL (DRAPES) ×2 IMPLANT
ELECT REM PT RETURN 9FT ADLT (ELECTROSURGICAL) ×2
ELECTRODE REM PT RTRN 9FT ADLT (ELECTROSURGICAL) ×1 IMPLANT
GLOVE BIO SURGEON STRL SZ7 (GLOVE) ×2 IMPLANT
GLOVE BIO SURGEON STRL SZ7.5 (GLOVE) ×4 IMPLANT
GLOVE BIOGEL PI IND STRL 7.5 (GLOVE) ×3 IMPLANT
GLOVE BIOGEL PI INDICATOR 7.5 (GLOVE) ×3
GLOVE SURG SS PI 7.0 STRL IVOR (GLOVE) ×2 IMPLANT
GOWN STRL REUS W/ TWL LRG LVL3 (GOWN DISPOSABLE) ×5 IMPLANT
GOWN STRL REUS W/TWL LRG LVL3 (GOWN DISPOSABLE) ×5
IV CATH 14GX2 1/4 (CATHETERS) ×2 IMPLANT
KIT BASIN OR (CUSTOM PROCEDURE TRAY) ×2 IMPLANT
KIT ROOM TURNOVER OR (KITS) ×2 IMPLANT
LIQUID BAND (GAUZE/BANDAGES/DRESSINGS) ×2 IMPLANT
NS IRRIG 1000ML POUR BTL (IV SOLUTION) ×2 IMPLANT
PAD ARMBOARD 7.5X6 YLW CONV (MISCELLANEOUS) ×2 IMPLANT
POUCH SPECIMEN RETRIEVAL 10MM (ENDOMECHANICALS) ×2 IMPLANT
SCISSORS LAP 5X35 DISP (ENDOMECHANICALS) ×2 IMPLANT
SET IRRIG TUBING LAPAROSCOPIC (IRRIGATION / IRRIGATOR) ×2 IMPLANT
SLEEVE ENDOPATH XCEL 5M (ENDOMECHANICALS) ×4 IMPLANT
SPECIMEN JAR SMALL (MISCELLANEOUS) ×2 IMPLANT
SUT MNCRL AB 4-0 PS2 18 (SUTURE) ×2 IMPLANT
TOWEL OR 17X24 6PK STRL BLUE (TOWEL DISPOSABLE) ×2 IMPLANT
TOWEL OR 17X26 10 PK STRL BLUE (TOWEL DISPOSABLE) ×2 IMPLANT
TRAY LAPAROSCOPIC (CUSTOM PROCEDURE TRAY) ×2 IMPLANT
TROCAR XCEL BLUNT TIP 100MML (ENDOMECHANICALS) ×2 IMPLANT
TROCAR XCEL NON-BLD 5MMX100MML (ENDOMECHANICALS) ×2 IMPLANT
TUBING INSUFFLATION (TUBING) ×2 IMPLANT

## 2014-03-27 NOTE — Anesthesia Procedure Notes (Signed)
Procedure Name: Intubation Date/Time: 03/27/2014 7:28 AM Performed by: Carmela RimaMARTINELLI, Timika Muench F Pre-anesthesia Checklist: Patient being monitored, Suction available, Emergency Drugs available, Patient identified and Timeout performed Patient Re-evaluated:Patient Re-evaluated prior to inductionOxygen Delivery Method: Circle system utilized Preoxygenation: Pre-oxygenation with 100% oxygen Intubation Type: IV induction, Rapid sequence and Cricoid Pressure applied Laryngoscope Size: Mac and 3 Grade View: Grade I Tube type: Oral Tube size: 7.0 mm Number of attempts: 1 Placement Confirmation: positive ETCO2,  ETT inserted through vocal cords under direct vision and breath sounds checked- equal and bilateral Secured at: 20 cm Tube secured with: Tape Dental Injury: Teeth and Oropharynx as per pre-operative assessment

## 2014-03-27 NOTE — Transfer of Care (Signed)
Immediate Anesthesia Transfer of Care Note  Patient: Kristi Robertson  Procedure(s) Performed: Procedure(s): LAPAROSCOPIC CHOLECYSTECTOMY WITH INTRAOPERATIVE CHOLANGIOGRAM (N/A)  Patient Location: PACU  Anesthesia Type:General  Level of Consciousness: oriented and sedated  Airway & Oxygen Therapy: Patient Spontanous Breathing and Patient connected to nasal cannula oxygen  Post-op Assessment: Report given to PACU RN, Post -op Vital signs reviewed and stable and Patient moving all extremities X 4  Post vital signs: Reviewed and stable  Complications: No apparent anesthesia complications

## 2014-03-27 NOTE — H&P (Signed)
Kristi Robertson 02/12/2014 10:21 AM Location: [facility] Patient #: [id number] DOB: Aug 28, 1985 Married / Language: English / Race: White Female  History of Present Illness Kristi Robertson(Paul S. Carolynne Edouardoth MD; 02/12/2014 12:18 PM) Patient words: Wants to discuss possible removal of gallbladder.  The patient is a 28 year old female who presents with abdominal pain. The patient is a 28 year old white female who is a previous patient of mine. Over the last several months she's been experiencing right upper quadrant pain. The pain is made worse after eating. The pain has been associated with nausea and vomiting. She has felt some chills but denies any fever. She has also had some diarrhea that comes and goes. She has had a CT scan and ultrasound that did not show gallbladder disease. One of her family members has had very similar symptoms also with a negative workup and their pain resolved after removing the gallbladder.   Other Problems Kristi Robertson(Jason McDowell, LPN; 40/98/119110/28/2015 10:22 AM) Back Pain Gastroesophageal Reflux Disease General anesthesia - complications Hypercholesterolemia Migraine Headache Oophorectomy  Past Surgical History Kristi Robertson(Jason McDowell, LPN; 47/82/956210/28/2015 10:22 AM) Hysterectomy (not due to cancer) - Complete Resection of Stomach  Diagnostic Studies History Kristi Robertson(Jason McDowell, LPN; 13/08/657810/28/2015 10:22 AM) Colonoscopy never Mammogram never Pap Smear 1-5 years ago  Allergies Kristi Robertson(Jason McDowell, LPN; 46/96/295210/28/2015 10:24 AM) Bactrim *ANTI-INFECTIVE AGENTS - MISC.* Codeine Sulfate *ANALGESICS - OPIOID* Demerol *ANALGESICS - OPIOID* Keflex *CEPHALOSPORINS* Sulfa Antibiotics  Medication History Kristi Robertson(Jason McDowell, LPN; 84/13/244010/28/2015 10:25 AM) Zantac 150 Maximum Strength (150MG  Tablet, Oral) Active. PriLOSEC OTC (20MG  Tablet DR, Oral) Active. Zofran (4MG  Tablet, Oral) Active.  Social History Kristi Robertson(Jason McDowell, LPN; 10/27/253610/28/2015 10:22 AM) Alcohol use Occasional alcohol use. Caffeine use  Carbonated beverages, Tea. No drug use Tobacco use Never smoker.  Family History Kristi Robertson(Jason McDowell, LPN; 64/40/347410/28/2015 10:22 AM) Depression Mother. Hypertension Mother. Migraine Headache Mother. Thyroid problems Mother.  Pregnancy / Birth History Kristi Robertson(Jason McDowell, LPN; 25/95/638710/28/2015 10:22 AM) Age at menarche 10 years. Gravida 1 Irregular periods Maternal age 29-25 Para 1  Review of Systems Kristi Robertson(Jason McDowell LPN; 56/43/329510/28/2015 10:22 AM) General Present- Appetite Loss, Fatigue and Night Sweats. Not Present- Chills, Fever, Weight Gain and Weight Loss. Skin Not Present- Change in Wart/Mole, Dryness, Hives, Jaundice, New Lesions, Non-Healing Wounds, Rash and Ulcer. HEENT Present- Wears glasses/contact lenses. Not Present- Earache, Hearing Loss, Hoarseness, Nose Bleed, Oral Ulcers, Ringing in the Ears, Seasonal Allergies, Sinus Pain, Sore Throat, Visual Disturbances and Yellow Eyes. Respiratory Not Present- Bloody sputum, Chronic Cough, Difficulty Breathing, Snoring and Wheezing. Breast Not Present- Breast Mass, Breast Pain, Nipple Discharge and Skin Changes. Gastrointestinal Present- Abdominal Pain, Bloating and Nausea. Not Present- Bloody Stool, Change in Bowel Habits, Chronic diarrhea, Constipation, Difficulty Swallowing, Excessive gas, Gets full quickly at meals, Hemorrhoids, Indigestion, Rectal Pain and Vomiting. Female Genitourinary Not Present- Frequency, Nocturia, Painful Urination, Pelvic Pain and Urgency. Musculoskeletal Present- Back Pain. Not Present- Joint Pain, Joint Stiffness, Muscle Pain, Muscle Weakness and Swelling of Extremities. Neurological Present- Headaches. Not Present- Decreased Memory, Fainting, Numbness, Seizures, Tingling, Tremor, Trouble walking and Weakness. Endocrine Present- Hot flashes. Not Present- Cold Intolerance, Excessive Hunger, Hair Changes, Heat Intolerance and New Diabetes. Hematology Present- Easy Bruising. Not Present- Excessive bleeding, Gland problems,  HIV and Persistent Infections.   Vitals Kristi Robertson(Jason McDowell LPN; 18/84/166010/28/2015 10:26 AM) 02/12/2014 10:25 AM Weight: 112.38 lb Height: 59in Body Surface Area: 1.46 m Body Mass Index: 22.7 kg/m Temp.: 98.75F(Temporal)  Pulse: 74 (Regular)  Resp.: 16 (Unlabored)  BP: 126/90 (Sitting, Left Arm, Standard)    Physical Exam (  Kristi GrossPaul S. Carolynne Edouardoth MD; 02/12/2014 12:19 PM) General Mental Status-Alert. General Appearance-Consistent with stated age. Hydration-Well hydrated. Voice-Normal.  Head and Neck Head-normocephalic, atraumatic with no lesions or palpable masses. Trachea-midline. Thyroid Gland Characteristics - normal size and consistency.  Eye Eyeball - Bilateral-Extraocular movements intact. Sclera/Conjunctiva - Bilateral-No scleral icterus.  Chest and Lung Exam Chest and lung exam reveals -quiet, even and easy respiratory effort with no use of accessory muscles and on auscultation, normal breath sounds, no adventitious sounds and normal vocal resonance. Inspection Chest Wall - Normal. Back - normal.  Cardiovascular Cardiovascular examination reveals -normal heart sounds, regular rate and rhythm with no murmurs and normal pedal pulses bilaterally.  Abdomen Note: The abdomen is soft. There is mild tenderness in the right upper quadrant. There is no guarding. There is no palpable mass. She does have well-healed laparoscopy scars.   Neurologic Neurologic evaluation reveals -alert and oriented x 3 with no impairment of recent or remote memory. Mental Status-Normal.  Musculoskeletal Normal Exam - Left-Upper Extremity Strength Normal and Lower Extremity Strength Normal. Normal Exam - Right-Upper Extremity Strength Normal and Lower Extremity Strength Normal.  Lymphatic Head & Neck  General Head & Neck Lymphatics: Bilateral - Description - Normal. Axillary  General Axillary Region: Bilateral - Description - Normal. Tenderness - Non  Tender. Femoral & Inguinal  Generalized Femoral & Inguinal Lymphatics: Bilateral - Description - Normal. Tenderness - Non Tender.    Assessment & Plan Renae Fickle(Paul S. 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 Her workup has been negative but she has had a family member with similar symptoms and a negative workup who benefited greatly from having her gallbladder removed. She seems convinced that her gallbladder is the source of her pain. She would like to have her gallbladder removed. I have discussed with her in detail the risks and benefits of the operation to remove the gallbladder as well as some of the technical aspects and she understands and wishes to proceed. She does understand that there is a chance that removing her gallbladder will not affect her pain at all and she is willing to accept this risk.     Signed by Caleen EssexPaul S Toth, MD (02/12/2014 12:20 PM)

## 2014-03-27 NOTE — Anesthesia Preprocedure Evaluation (Addendum)
Anesthesia Evaluation  Patient identified by MRN, date of birth, ID band Patient awake    Reviewed: Allergy & Precautions, H&P , NPO status , Patient's Chart, lab work & pertinent test results  History of Anesthesia Complications (+) PONV and history of anesthetic complications  Airway Mallampati: II       Dental  (+) Teeth Intact, Dental Advidsory Given   Pulmonary neg pulmonary ROS,    Pulmonary exam normal       Cardiovascular Rhythm:regular     Neuro/Psych  Headaches, Anxiety    GI/Hepatic Neg liver ROS, GERD-  Medicated and Poorly Controlled,  Endo/Other  negative endocrine ROS  Renal/GU negative Renal ROS     Musculoskeletal   Abdominal   Peds  Hematology   Anesthesia Other Findings   Reproductive/Obstetrics                            Anesthesia Physical Anesthesia Plan  ASA: II  Anesthesia Plan: General ETT and General   Post-op Pain Management:    Induction: Intravenous, Rapid sequence and Cricoid pressure planned  Airway Management Planned: Oral ETT  Additional Equipment:   Intra-op Plan:   Post-operative Plan: Extubation in OR  Informed Consent: I have reviewed the patients History and Physical, chart, labs and discussed the procedure including the risks, benefits and alternatives for the proposed anesthesia with the patient or authorized representative who has indicated his/her understanding and acceptance.   Dental Advisory Given  Plan Discussed with: Anesthesiologist, CRNA and Surgeon  Anesthesia Plan Comments:        Anesthesia Quick Evaluation

## 2014-03-27 NOTE — Interval H&P Note (Signed)
History and Physical Interval Note:  03/27/2014 7:07 AM  Eun Hardeman  has presented today for surgery, with the diagnosis of Biliary Colic  The various methods of treatment have been discussed with the patient and family. After consideration of risks, benefits and other options for treatment, the patient has consented to  Procedure(s): LAPAROSCOPIC CHOLECYSTECTOMY WITH INTRAOPERATIVE CHOLANGIOGRAM (N/A) as a surgical intervention .  The patient's history has been reviewed, patient examined, no change in status, stable for surgery.  I have reviewed the patient's chart and labs.  Questions were answered to the patient's satisfaction.     TOTH III,Keyonte Cookston S

## 2014-03-27 NOTE — Discharge Instructions (Signed)

## 2014-03-27 NOTE — Anesthesia Postprocedure Evaluation (Signed)
Anesthesia Post Note  Patient: Kristi Robertson  Procedure(s) Performed: Procedure(s) (LRB): LAPAROSCOPIC CHOLECYSTECTOMY WITH INTRAOPERATIVE CHOLANGIOGRAM (N/A)  Anesthesia type: general  Patient location: PACU  Post pain: Pain level controlled  Post assessment: Patient's Cardiovascular Status Stable  Last Vitals:  Filed Vitals:   03/27/14 1017  BP:   Pulse: 106  Temp:   Resp: 16    Post vital signs: Reviewed and stable  Level of consciousness: sedated  Complications: No apparent anesthesia complications

## 2014-03-27 NOTE — Op Note (Signed)
03/27/2014  8:33 AM  PATIENT:  Kristi Robertson  28 y.o. female  PRE-OPERATIVE DIAGNOSIS:  Biliary Colic  POST-OPERATIVE DIAGNOSIS:  Biliary Colic  PROCEDURE:  Procedure(s): LAPAROSCOPIC CHOLECYSTECTOMY WITH INTRAOPERATIVE CHOLANGIOGRAM (N/A)  SURGEON:  Surgeon(s) and Role:    * Griselda MinerPaul Toth III, MD - Primary    * Manus RuddMatthew Tsuei, MD - Assisting  PHYSICIAN ASSISTANT:   ASSISTANTS: Dr. Corliss Skainssuei   ANESTHESIA:   general  EBL:  Total I/O In: 1000 [I.V.:1000] Out: -   BLOOD ADMINISTERED:none  DRAINS: none   LOCAL MEDICATIONS USED:  MARCAINE     SPECIMEN:  Source of Specimen:  gallbladder  DISPOSITION OF SPECIMEN:  PATHOLOGY  COUNTS:  YES  TOURNIQUET:  * No tourniquets in log *  DICTATION: .Dragon Dictation  @opnoteheader @  Procedure: After informed consent was obtained the patient was brought to the operating room and placed in the supine position on the operating room table. After adequate induction of general anesthesia the patient's abdomen was prepped with ChloraPrep allowed to dry and draped in usual sterile manner. The area below the umbilicus was infiltrated with quarter percent  Marcaine. A small incision was made with a 15 blade knife. The incision was carried down through the subcutaneous tissue bluntly with a hemostat and [employer] retractors. The linea alba was identified. The linea alba was incised with a 15 blade knife and each side was grasped with Coker clamps. The preperitoneal space was then probed with a hemostat until the peritoneum was opened and access was gained to the abdominal cavity. A 0 Vicryl pursestring stitch was placed in the fascia surrounding the opening. A Hassan cannula was then placed through the opening and anchored in place with the previously placed Vicryl purse string stitch. The abdomen was insufflated with carbon dioxide without difficulty. A laparoscope was inserted through the [facility]ula in the right upper quadrant was inspected. Next the  epigastric region was infiltrated with % Marcaine. A small incision was made with a 15 blade knife. A 5 mm port was placed bluntly through this incision into the abdominal cavity under direct vision. Next 2 sites were chosen laterally on the right side of the abdomen for placement of 5 mm ports. Each of these areas was infiltrated with quarter percent Marcaine. Small stab incisions were made with a 15 blade knife. 5 mm ports were then placed bluntly through these incisions into the abdominal cavity under direct vision without difficulty. A blunt grasper was placed through the lateralmost 5 mm port and used to grasp the dome of the gallbladder and elevated anteriorly and superiorly. Another blunt grasper was placed through the other 5 mm port and used to retract the body and neck of the gallbladder. A dissector was placed through the epigastric port and using the electrocautery the peritoneal reflection at the gallbladder neck was opened. Blunt dissection was then carried out in this area until the gallbladder neck-cystic duct junction was readily identified and a good window was created. A single clip was placed on the gallbladder neck. A small  ductotomy was made just below the clip with laparoscopic scissors. A 14-gauge Angiocath was then placed through the anterior abdominal wall under direct vision. A Reddick cholangiogram catheter was then placed through the Angiocath and flushed. The catheter was then placed in the cystic duct and anchored in place with a clip. A cholangiogram was obtained that showed no filling defects good emptying into the duodenum an adequate length on the cystic duct. The anchoring clip and catheters  were then removed from the patient. 3 clips were placed proximally on the cystic duct and the duct was divided between the 2 sets of clips. Posterior to this the cystic artery was identified and again dissected bluntly in a circumferential manner until a good window  was created. 2 clips  were placed proximally and one distally on the artery and the artery was divided between the 2 sets of clips. Next a laparoscopic hook cautery device was used to separate the gallbladder from the liver bed. Prior to completely detaching the gallbladder from the liver bed the liver bed was inspected and several small bleeding points were coagulated with the electrocautery until the area was completely hemostatic. The gallbladder was then detached the rest of it from the liver bed without difficulty. A laparoscopic bag was inserted through the [facility] canula. The gallbladder was placed within the bag and the bag was sealed. A laparoscope was then moved to the epigastric port.  The bag with the gallbladder was then removed with the [facility] cannula through the infraumbilical port without difficulty. The fascial defect was then closed with the previously placed Vicryl pursestring stitch as well as with another figure-of-eight 0 Vicryl stitch. The liver bed was inspected again and found to be hemostatic. The abdomen was irrigated with copious amounts of saline until the effluent was clear. The ports were then removed under direct vision without difficulty and were found to be hemostatic. The gas was allowed to escape. The skin incisions were all closed with interrupted 4-0 Monocryl subcuticular stitches. Dermabond dressings were applied. The patient tolerated the procedure well. At the end of the case all needle sponge and instrument counts were correct. The patient was then awakened and taken to recovery in stable condition  PLAN OF CARE: Discharge to home after PACU  PATIENT DISPOSITION:  PACU - hemodynamically stable.   Delay start of Pharmacological VTE agent (>24hrs) due to surgical blood loss or risk of bleeding: not applicable

## 2014-03-28 ENCOUNTER — Encounter (HOSPITAL_COMMUNITY): Payer: Self-pay | Admitting: General Surgery

## 2014-06-11 ENCOUNTER — Ambulatory Visit (INDEPENDENT_AMBULATORY_CARE_PROVIDER_SITE_OTHER): Payer: 59 | Admitting: Gastroenterology

## 2014-06-11 ENCOUNTER — Encounter: Payer: Self-pay | Admitting: Gastroenterology

## 2014-06-11 VITALS — BP 118/72 | HR 72 | Ht <= 58 in | Wt 111.4 lb

## 2014-06-11 DIAGNOSIS — R12 Heartburn: Secondary | ICD-10-CM

## 2014-06-11 MED ORDER — PANTOPRAZOLE SODIUM 40 MG PO TBEC: 40.0000 mg | DELAYED_RELEASE_TABLET | Freq: Every day | ORAL | Status: AC

## 2014-06-11 NOTE — Patient Instructions (Addendum)
We will get records sent from your previous gastroenter[place] in [place] from around 2014 for review.  This will include any endoscopic (colonoscopy or upper endoscopy) procedures, any associated pathology reports, most recent office note.   Continue zantac at bedtime every night. Switch to protonix, 20-30 min before lunch meal. Please return to see Dr. Christella HartiganJacobs on 08/29/14 at 9 am.

## 2014-06-11 NOTE — Progress Notes (Signed)
HPI: This is a    Very pleasant 29 year old woman whom I am meeting for the first time today.  Has epigsatric burning, in chest as well.  Feels like gas is trapped in chest. The burning is present all day.    Feels choking and also solid food dysphagia (seems more frequent lately).  Weight has been stable in past year.  She was taking ibuprofen a lot in past  Currently alleve one pill once a week.  Had EGD August 2014; ? GERD, did biopsies from duodenum.  No spure.   Had barium esophagram 2014  Takes omeprazole in AM before BF (2 hours).  Also zantac at bedtime.   Drinks a lot caffeine recently 2 oz soda daily.      Review of systems: Pertinent positive and negative review of systems were noted in the above HPI section. Complete review of systems was performed and was otherwise normal.    Past Medical History  Diagnosis Date  . Acid reflux   . Anxiety   . PONV (postoperative nausea and vomiting)     severe  . Ovarian cyst     left side  . Headache     migraines  . Wears glasses     Past Surgical History  Procedure Laterality Date  . Cyst removed from ovary Right july 2010  . Fallopian tube and ovary removed  july 2011  . Abdominal hysterectomy  October 20, 2011  . Excision to groin Left     I and D multiple times  . Inguinal hernia repair N/A 07/12/2013    Procedure: EXCISION HIDRADENITIS GROIN;  Surgeon: Robyne AskewPaul S Toth III, MD;  Location: [facility];  Service: General;  Laterality: N/A;  . Cholecystectomy N/A 03/27/2014    Procedure: LAPAROSCOPIC CHOLECYSTECTOMY WITH INTRAOPERATIVE CHOLANGIOGRAM;  Surgeon: Chevis PrettyPaul Toth III, MD;  Location: [facility];  Service: General;  Laterality: N/A;    Current Outpatient Prescriptions  Medication Sig Dispense Refill  . acetaminophen (TYLENOL) 500 MG tablet Take 1,000 mg by mouth every 6 (six) hours as needed for mild pain or moderate pain.    Marland Kitchen. ALPRAZolam (XANAX) 0.5 MG tablet Take 0.5 mg by mouth at bedtime as needed for anxiety.     Marland Kitchen.  ibuprofen (ADVIL,MOTRIN) 200 MG tablet Take 400 mg by mouth every 6 (six) hours as needed for headache.     . ibuprofen (ADVIL,MOTRIN) 800 MG tablet Take 800 mg by mouth every 8 (eight) hours as needed for mild pain.    Marland Kitchen. omeprazole (PRILOSEC) 40 MG capsule Take 40 mg by mouth daily.     . ondansetron (ZOFRAN ODT) 4 MG disintegrating tablet Take 1 tablet (4 mg total) by mouth every 8 (eight) hours as needed for nausea or vomiting. 20 tablet 0  . ranitidine (ZANTAC) 150 MG tablet Take 150 mg by mouth at bedtime.      No current facility-administered medications for this visit.    Allergies as of 06/11/2014 - Review Complete 06/11/2014  Allergen Reaction Noted  . Ancef [cefazolin] Hives and Itching 03/19/2014  . Bactrim [sulfamethoxazole-trimethoprim] Other (See Comments) 04/02/2013  . Codeine Nausea And Vomiting 04/02/2013  . Demerol [meperidine] Nausea And Vomiting 04/02/2013  . Keflex [cephalexin] Other (See Comments) 04/02/2013  . Sulfa antibiotics Other (See Comments) 05/20/2013    Family History  Problem Relation Age of Onset  . Hypertension Mother   . Hypercholesterolemia Mother   . Stroke Maternal Uncle   . Colon cancer Neg Hx   .  Colon polyps Neg Hx   . Kidney disease Neg Hx   . Diabetes Maternal Aunt   . Diabetes Maternal Uncle     x3  . Esophageal cancer Neg Hx   . Gallbladder disease Neg Hx     History   Social History  . Marital Status: Married    Spouse Name: N/A  . Number of Children: 2  . Years of Education: N/A   Occupational History  . Medical Billing    Social History Main Topics  . Smoking status: Never Smoker   . Smokeless tobacco: Never Used  . Alcohol Use: Yes     Comment: occasional  . Drug Use: No  . Sexual Activity: Yes    Birth Control/ Protection: Surgical   Other Topics Concern  . Not on file   Social History Narrative       Physical Exam: Ht  (1.473 m)  Wt 111 lb 6 oz (50.519 kg)  BMI 23.28 kg/m2 Constitutional:  generally well-appearing Psychiatric: alert and oriented x3 Eyes: extraocular movements intact Mouth: oral pharynx moist, no lesions Neck: supple no lymphadenopathy Cardiovascular: heart regular rate and rhythm Lungs: clear to auscultation bilaterally Abdomen: soft, nontender, nondistended, no obvious ascites, no peritoneal signs, normal bowel sounds Extremities: no lower extremity edema bilaterally Skin: no lesions on visible extremities    Assessment and plan: 29 y.o. female with   Pyrosis , dyspepsia   she is going to change proton pump inhibitors from omeprazole to Protonix prescription strength. I recommended she try take it shortly before her lunch meal as that seems to be the most reliable meal of her day. She will continue H2 blocker at night. We will get records from her previous gastroenterologist for review. She will at least return to see me in 6-7 weeks and I will get back to her after reviewing the records if it is any change in plans prior to then.

## 2014-06-23 ENCOUNTER — Telehealth: Payer: Self-pay | Admitting: Gastroenterology

## 2014-06-23 NOTE — Telephone Encounter (Signed)
EGD 11/2012 Dr. Jason FilaBray in [place], EGD indications "abd pain at multiple sites, GERD, dysphagia."  Findins "loss of villi in duodenum" otherwise normal.  Pathology showed normal duodenum mucosa.

## 2014-08-29 ENCOUNTER — Ambulatory Visit: Payer: 59 | Admitting: Gastroenterology

## 2014-10-24 ENCOUNTER — Other Ambulatory Visit (INDEPENDENT_AMBULATORY_CARE_PROVIDER_SITE_OTHER): Payer: 59

## 2014-10-24 ENCOUNTER — Encounter: Payer: Self-pay | Admitting: Gastroenterology

## 2014-10-24 ENCOUNTER — Ambulatory Visit (INDEPENDENT_AMBULATORY_CARE_PROVIDER_SITE_OTHER): Payer: 59 | Admitting: Gastroenterology

## 2014-10-24 VITALS — BP 100/68 | HR 84 | Ht <= 58 in | Wt 120.2 lb

## 2014-10-24 DIAGNOSIS — K219 Gastro-esophageal reflux disease without esophagitis: Secondary | ICD-10-CM | POA: Diagnosis not present

## 2014-10-24 LAB — COMPREHENSIVE METABOLIC PANEL
ALBUMIN: 4.2 g/dL (ref 3.5–5.2)
ALK PHOS: 56 U/L (ref 39–117)
ALT: 20 U/L (ref 0–35)
AST: 20 U/L (ref 0–37)
BUN: 9 mg/dL (ref 6–23)
CALCIUM: 9.4 mg/dL (ref 8.4–10.5)
CHLORIDE: 103 meq/L (ref 96–112)
CO2: 30 meq/L (ref 19–32)
Creatinine, Ser: 0.76 mg/dL (ref 0.40–1.20)
GFR: 95.21 mL/min (ref >60.00)
GLUCOSE: 92 mg/dL (ref 70–99)
POTASSIUM: 4.1 meq/L (ref 3.5–5.1)
SODIUM: 139 meq/L (ref 135–145)
TOTAL PROTEIN: 7.1 g/dL (ref 6.0–8.3)
Total Bilirubin: 0.5 mg/dL (ref 0.2–1.2)

## 2014-10-24 NOTE — Patient Instructions (Addendum)
Change protonix to 20-30 min before dinner meal. Continue zantac at bedtime every night. You will have labs checked today in the basement lab.  Please head down after you check out with the front desk  (cmet).

## 2014-10-24 NOTE — Progress Notes (Signed)
Review of pertinent gastrointestinal problems: 1. Pyrosis, dyspepsia; EGD 11/2012 Dr. Jason FilaBray in [place], EGD indications "abd pain at multiple sites, GERD, dysphagia." Findins "loss of villi in duodenum" otherwise normal. Pathology showed normal duodenum mucosa.  Establish care with Dr. Christella HartiganJacobs 2016, recommended she change the way she was taking her proton pump inhibitor and add H2 blocker at night.  HPI: This is a   very pleasant 29 year old woman whom I last saw 2 or 3 months ago   Chief complaint is GERD, mild recent right upper quadrant pain  Has not been taking PPI reliably, takes about 3 days out of a week.    Has been taking h2 blocker at bedtime.  The pain in her right side was brief, it was reminiscent of her gallbladder pains for which she had gallbladder resection about a year ago. She did not have gallstones  Past Medical History  Diagnosis Date  . Acid reflux   . Anxiety   . PONV (postoperative nausea and vomiting)     severe  . Ovarian cyst     left side  . Headache     migraines, cluster  . Wears glasses     Past Surgical History  Procedure Laterality Date  . Cyst removed from ovary Right july 2010  . Fallopian tube and ovary removed  july 2011  . Abdominal hysterectomy  October 20, 2011  . Excision to groin Left     I and D multiple times  . Inguinal hernia repair N/A 07/12/2013    Procedure: EXCISION HIDRADENITIS GROIN;  Surgeon: Robyne AskewPaul S Toth III, MD;  Location: [facility];  Service: General;  Laterality: N/A;  . Cholecystectomy N/A 03/27/2014    Procedure: LAPAROSCOPIC CHOLECYSTECTOMY WITH INTRAOPERATIVE CHOLANGIOGRAM;  Surgeon: Chevis PrettyPaul Toth III, MD;  Location: [facility];  Service: General;  Laterality: N/A;    Current Outpatient Prescriptions  Medication Sig Dispense Refill  . acetaminophen (TYLENOL) 500 MG tablet Take 1,000 mg by mouth every 6 (six) hours as needed for mild pain or moderate pain.    Marland Kitchen. ALPRAZolam (XANAX) 0.5 MG tablet Take 0.5 mg by mouth at bedtime  as needed for anxiety.     . cyproheptadine (PERIACTIN) 4 MG tablet     . doxycycline (VIBRAMYCIN) 100 MG capsule     . ibuprofen (ADVIL,MOTRIN) 200 MG tablet Take 400 mg by mouth every 6 (six) hours as needed for headache.     . ibuprofen (ADVIL,MOTRIN) 800 MG tablet Take 800 mg by mouth every 8 (eight) hours as needed for mild pain.    Marland Kitchen. ondansetron (ZOFRAN ODT) 4 MG disintegrating tablet Take 1 tablet (4 mg total) by mouth every 8 (eight) hours as needed for nausea or vomiting. 20 tablet 0  . pantoprazole (PROTONIX) 40 MG tablet Take 1 tablet (40 mg total) by mouth daily. 90 tablet 3  . ranitidine (ZANTAC) 150 MG tablet Take 150 mg by mouth at bedtime.      No current facility-administered medications for this visit.    Allergies as of 10/24/2014 - Review Complete 10/24/2014  Allergen Reaction Noted  . Ancef [cefazolin] Hives and Itching 03/19/2014  . Bactrim [sulfamethoxazole-trimethoprim] Other (See Comments) 04/02/2013  . Codeine Nausea And Vomiting 04/02/2013  . Demerol [meperidine] Nausea And Vomiting 04/02/2013  . Keflex [cephalexin] Other (See Comments) 04/02/2013  . Sulfa antibiotics Other (See Comments) 05/20/2013    Family History  Problem Relation Age of Onset  . Hypertension Mother   . Hypercholesterolemia Mother   .  Stroke Maternal Uncle   . Colon cancer Neg Hx   . Colon polyps Neg Hx   . Kidney disease Neg Hx   . Diabetes Maternal Aunt   . Diabetes Maternal Uncle     x3  . Esophageal cancer Neg Hx   . Gallbladder disease Neg Hx     History   Social History  . Marital Status: Married    Spouse Name: N/A  . Number of Children: 2  . Years of Education: N/A   Occupational History  . Medical Billing    Social History Main Topics  . Smoking status: Never Smoker   . Smokeless tobacco: Never Used  . Alcohol Use: Yes     Comment: occasional  . Drug Use: No  . Sexual Activity: Yes    Birth Control/ Protection: Surgical   Other Topics Concern  . Not on  file   Social History Narrative     Physical Exam: BP 100/68 mmHg  Pulse 84  Ht  (1.473 m)  Wt 120 lb 3.2 oz (54.522 kg)  BMI 25.13 kg/m2 Constitutional: generally well-appearing Psychiatric: alert and oriented x3 Abdomen: soft, nontender, nondistended, no obvious ascites, no peritoneal signs, normal bowel sounds   Assessment and plan: 29 y.o. female with . Right quadrant abdominal pain, GERD  Her GERD symptoms are well controlled as long she takes proton pump inhibitor and H2 blocker. She will get a set of liver tests D today to check for signs of recurrent, choledocholithiasis area and she knows to call here if those right upper quadrant pains worsen.   Rob Bunting, MD [facility] 10/24/2014, 3:59 PM

## 2014-11-18 DIAGNOSIS — K219 Gastro-esophageal reflux disease without esophagitis: Secondary | ICD-10-CM | POA: Insufficient documentation

## 2015-02-10 ENCOUNTER — Telehealth: Payer: Self-pay | Admitting: Gastroenterology

## 2015-02-10 NOTE — Telephone Encounter (Signed)
Left message on machine to call back  

## 2015-02-10 NOTE — Telephone Encounter (Signed)
Patient called the on-call number this evening. She has had 3 episodes of bright red blood per rectum starting yesterday and today. Difficult to quantify how much, although seems to be lessening. Seen in urgent care and had a normal Hgb with rectal showing hemorrhoid which sounds like this may be the cause. Overnight tonight she will monitor her course. If the bleeding gets worse or she is concerned she can seek evaluation in the ED. I otherwise told her I would pass this to Dr. Christella HartiganJacobs her primary GI to discuss further evaluation. She agreed.

## 2015-02-11 ENCOUNTER — Telehealth: Payer: Self-pay | Admitting: Gastroenterology

## 2015-02-11 NOTE — Telephone Encounter (Signed)
Please see OV note dated 02-11-15

## 2015-02-11 NOTE — Telephone Encounter (Signed)
Spoke to patient she is having concerns about bright red bleeding with a BM. It started around 2 days ago. She is requesting an appointment for evaluation, apt scheduled with Doug SouJessica Zehr on 02-12-15 at 2:00pm. Patient was advised to go to the ED if bleeding and pain gets worse. Patient verbalized understanding.

## 2015-02-11 NOTE — Telephone Encounter (Signed)
Called patient no answer msg left to contact office 

## 2015-02-12 ENCOUNTER — Ambulatory Visit (INDEPENDENT_AMBULATORY_CARE_PROVIDER_SITE_OTHER): Payer: 59 | Admitting: Gastroenterology

## 2015-02-12 ENCOUNTER — Encounter: Payer: Self-pay | Admitting: Gastroenterology

## 2015-02-12 VITALS — BP 122/78 | HR 88 | Ht <= 58 in | Wt 124.2 lb

## 2015-02-12 DIAGNOSIS — K648 Other hemorrhoids: Secondary | ICD-10-CM | POA: Diagnosis not present

## 2015-02-12 DIAGNOSIS — K625 Hemorrhage of anus and rectum: Secondary | ICD-10-CM

## 2015-02-12 DIAGNOSIS — K649 Unspecified hemorrhoids: Secondary | ICD-10-CM | POA: Insufficient documentation

## 2015-02-12 MED ORDER — HYDROCORTISONE ACETATE 25 MG RE SUPP: 25.0000 mg | Freq: Two times a day (BID) | RECTAL | Status: DC

## 2015-02-12 NOTE — Patient Instructions (Signed)
We sent a prescription for Anusol HC Suppositories to [facility], [place].  Use twice daily x 10 days.  Call us with a progress report after the 10 days.

## 2015-02-12 NOTE — Progress Notes (Signed)
     02/12/2015 Kristi Robertson [id number] 06/01/1985   History of Present Illness:  This is a pleasant 29 year old  female who is known to Dr. Christella HartiganJacobs. She is typically seen for reflux and heartburn. This time she presents to our office with complaints of rectal bleeding. She says that she does not tend to have constipation, but did have a harder stool on Monday and then began seeing some bright red blood in the toilet bowl after that bowel movement. It then continued a couple of occasions in the toilet bowl when she would go to urinate it would trip from her rectum.  It is lessening and there was just a small amount present this morning with BM. She does admit that she does not drink nearly as much water or fluids throughout the day. She does have some ongoing mild right lower quadrant abdominal pain that has been present for quite some time.  Had gone to urgent care and says that her Hgb was normal.   Current Medications, Allergies, Past Medical History, Past Surgical History, Family History and Social History were reviewed in [facility] electronic medical record.   Physical Exam: BP 122/78 mmHg  Pulse 88  Ht 4' 9.5" (1.461 m)  Wt 124 lb 4 oz (56.359 kg)  BMI 26.40 kg/m2 General: Well developed white female in no acute distress Head: Normocephalic and atraumatic Eyes:  Sclerae anicteric, conjunctiva pink  Ears: Normal auditory acuity Lungs: Clear throughout to auscultation Heart: Regular rate and rhythm Abdomen: Soft, non-distended.  Normal bowel sound.  Non-tender. Rectal:  Small non-bleeding external hemorrhoids seen.  DRE did not reveal any masses.  Small amount of light brown stool on exam glove that was heme negative.  Anoscopy performed and revealed a bleeding hemorrhoid posteriorly that was aggravated by my exam.   Musculoskeletal: Symmetrical with no gross deformities  Extremities: No edema  Neurological: Alert oriented x 4, grossly non-focal Psychological:  Alert and  cooperative. Normal mood and affect  Assessment and Recommendations: -Rectal bleeding from internal hemorrhoid that was seen on exam today:  Will treat with anusol suppositories BID x 10 days.  Keep stools soft my increasing water/fluid and fiber intake.

## 2015-02-13 NOTE — Progress Notes (Signed)
i agree with the above note, plan 

## 2015-04-02 ENCOUNTER — Encounter: Payer: Self-pay | Admitting: Physician Assistant

## 2015-04-02 ENCOUNTER — Ambulatory Visit (INDEPENDENT_AMBULATORY_CARE_PROVIDER_SITE_OTHER): Payer: 59 | Admitting: Physician Assistant

## 2015-04-02 ENCOUNTER — Encounter: Payer: Self-pay | Admitting: Gastroenterology

## 2015-04-02 VITALS — BP 100/64 | HR 84 | Ht <= 58 in | Wt 125.5 lb

## 2015-04-02 DIAGNOSIS — K219 Gastro-esophageal reflux disease without esophagitis: Secondary | ICD-10-CM

## 2015-04-02 DIAGNOSIS — R1310 Dysphagia, unspecified: Secondary | ICD-10-CM | POA: Diagnosis not present

## 2015-04-02 DIAGNOSIS — R140 Abdominal distension (gaseous): Secondary | ICD-10-CM | POA: Diagnosis not present

## 2015-04-02 MED ORDER — POLYETHYLENE GLYCOL 3350 17 GM/SCOOP PO POWD: ORAL | Status: DC

## 2015-04-02 MED ORDER — POLYETHYLENE GLYCOL 3350 17 GM/SCOOP PO POWD: 1.0000 | Freq: Once | ORAL | Status: DC

## 2015-04-02 NOTE — Progress Notes (Signed)
i agree with the above note, plan 

## 2015-04-02 NOTE — Progress Notes (Signed)
Patient ID: Kristi Robertson, female   DOB: 12/04/1985, 29 y.o.   MRN: [id number]   Subjective:    Patient ID: Kristi Robertson, female    DOB: [birth year], 29 y.o.   MRN: [id number]  HPI [name] is a 29 year old white female known to Dr. Christella Hartigan. She was just seen in the office on 02/12/2015 for complaints of rectal bleeding which was felt to be hemorrhoidal in etiology. Patient does have history of GERD and is status post laparoscopic cholecystectomy in December 2015. She had previous EGD in [place] will as well as barium swallow in 2015. Patient says she was told she had a hiatal hernia but otherwise negative exam. She comes in today stating that she's not had any further problems with rectal bleeding. She has had abdominal bloating over the past month. He then developed symptoms with a burning sensation in her chest especially with deep inspiration. She thought this was related to a cold area she was seen by her PCP who gave her a GI cocktail which did relieve her symptoms. She has been taking Zantac at bedtime since which has been somewhat helpful and she says Tums or also helpful area and she has a prescription for Protonix but has not been taking it regularly because she doesnt  eat breakfast and didn't think it would work without eating. She continues to have some vague discomfort and burning sensation in her chest and also has a feeling that food is "sitting" for a long time. She's not had any episodes of regurgitation says it generally gradually goes down. Not on  any regular aspirin or NSAIDs.  Review of Systems Pertinent positive and negative review of systems were noted in the above HPI section.  All other review of systems was otherwise negative.  Outpatient Encounter Prescriptions as of 04/02/2015  Medication Sig  . acetaminophen (TYLENOL) 500 MG tablet Take 1,000 mg by mouth every 6 (six) hours as needed for mild pain or moderate pain.  Marland Kitchen ALPRAZolam (XANAX) 0.5 MG tablet Take 0.5 mg by  mouth at bedtime as needed for anxiety.   . cyproheptadine (PERIACTIN) 4 MG tablet   . hydrocortisone (ANUSOL-HC) 25 MG suppository Place 1 suppository (25 mg total) rectally 2 (two) times daily.  Marland Kitchen ibuprofen (ADVIL,MOTRIN) 200 MG tablet Take 400 mg by mouth every 6 (six) hours as needed for headache.   . ibuprofen (ADVIL,MOTRIN) 800 MG tablet Take 800 mg by mouth every 8 (eight) hours as needed for mild pain.  Marland Kitchen ondansetron (ZOFRAN ODT) 4 MG disintegrating tablet Take 1 tablet (4 mg total) by mouth every 8 (eight) hours as needed for nausea or vomiting.  . pantoprazole (PROTONIX) 40 MG tablet Take 1 tablet (40 mg total) by mouth daily.  . ranitidine (ZANTAC) 150 MG tablet Take 150 mg by mouth at bedtime.   . Vitamin D, Ergocalciferol, (DRISDOL) 50000 UNITS CAPS capsule Take by mouth.  . polyethylene glycol powder (GLYCOLAX/MIRALAX) powder Take 17 grams in 8 oz of water or juice every morning daily or every other day.  . [DISCONTINUED] polyethylene glycol powder (GLYCOLAX/MIRALAX) powder Take 255 g by mouth once.   No facility-administered encounter medications on file as of 04/02/2015.   Allergies  Allergen Reactions  . Ancef [Cefazolin] Hives and Itching  . Bactrim [Sulfamethoxazole-Trimethoprim] Other (See Comments)    Heart racing  . Codeine Nausea And Vomiting  . Demerol [Meperidine] Nausea And Vomiting  . Keflex [Cephalexin] Other (See Comments)    Heart racing  . Sulfa Antibiotics Other (See  Comments)    Heart racing   Patient Active Problem List   Diagnosis Date Noted  . Rectal bleeding 02/12/2015  . Internal hemorrhoids 02/12/2015  . Hidradenitis 06/21/2013  . Palpitations 04/03/2013   Social History   Social History  . Marital Status: Married    Spouse Name: N/A  . Number of Children: 2  . Years of Education: N/A   Occupational History  . Medical Billing    Social History Main Topics  . Smoking status: Never Smoker   . Smokeless tobacco: Never Used  . Alcohol  Use: Yes     Comment: occasional  . Drug Use: No  . Sexual Activity: Yes    Birth Control/ Protection: Surgical   Other Topics Concern  . Not on file   Social History Narrative    [name]'s family history includes Diabetes in her maternal aunt and maternal uncle; Hypercholesterolemia in her mother; Hypertension in her mother; Stroke in her maternal uncle. There is no history of Colon cancer, Colon polyps, Kidney disease, Esophageal cancer, or Gallbladder disease.      Objective:    Filed Vitals:   04/02/15 0908  BP: 100/64  Pulse: 84    Physical Exam  well-developed white female in no acute distress, pleasant blood pressure 100/64 pulse 84 height 4 foot 10 weight 125. HEENT ;nontraumatic normocephalic EOMI PERRLA sclera anicteric, Cardiovascular; regular rate and rhythm with S1-S2 no murmur or gallop, Pulmonary; clear bilaterally, Abdomen ;soft bowel sounds are present, Basically nontender there is no palpable mass or hepatosplenomegaly, Rectal ;exam not done,  clubbing cyanosis or edema skin warm dry, Neuropsych; mood and affect appropriate       Assessment & Plan:   #1 29 yo female with hx GERD with recent worsening of sxs with burning discomfort in lower chest , and vague solid food dysphagia- probable mild esophagitis -r/o stricture  #2 abdominal bloating /constipation  Plan; Take protonix 40 mg po QAm Continue zantac 300 mg qhs for now Schedule for EGD with Dr Christella HartiganJacobs with possible dilation. Procedure discussed in detail with pt and she is agreeable to proceed. Add daily probiotic - Align or culturelle for bloating and start trial of miralax 17 gm daily or QOD fro constipation   Atonya Templer S Ashlan Dignan PA-C 04/02/2015   Cc: Jamal CollinHedgecock, Suzanne, PA-C

## 2015-04-02 NOTE — Patient Instructions (Signed)
Continue the Zantac 300 mg at bedtime. Add Protonix 40 mg by mouth every morning.  We sent generic Miralax to your pharmacy, [facility]. 17 grams in 8 oz of water daily, or every other day. Take Align, 1 capsule daily. [facility] or [facility].  We have given you an antireflux information sheet and brochure.

## 2015-04-14 ENCOUNTER — Encounter: Payer: Self-pay | Admitting: Gastroenterology

## 2015-04-14 ENCOUNTER — Ambulatory Visit (AMBULATORY_SURGERY_CENTER): Payer: 59 | Admitting: Gastroenterology

## 2015-04-14 VITALS — BP 123/85 | HR 85 | Temp 98.4°F | Resp 10 | Ht <= 58 in | Wt 125.0 lb

## 2015-04-14 DIAGNOSIS — K2970 Gastritis, unspecified, without bleeding: Secondary | ICD-10-CM | POA: Diagnosis not present

## 2015-04-14 DIAGNOSIS — R1310 Dysphagia, unspecified: Secondary | ICD-10-CM | POA: Diagnosis not present

## 2015-04-14 DIAGNOSIS — K2950 Unspecified chronic gastritis without bleeding: Secondary | ICD-10-CM | POA: Diagnosis not present

## 2015-04-14 MED ORDER — SODIUM CHLORIDE 0.9 % IV SOLN: 500.0000 mL | INTRAVENOUS | Status: DC

## 2015-04-14 MED ORDER — ALIGN PO CAPS: 1.0000 | ORAL_CAPSULE | Freq: Every day | ORAL | Status: DC

## 2015-04-14 NOTE — Progress Notes (Signed)
Called to room to assist during endoscopic procedure.  Patient ID and intended procedure confirmed with present staff. Received instructions for my participation in the procedure from the performing physician.  

## 2015-04-14 NOTE — Patient Instructions (Signed)

## 2015-04-14 NOTE — Op Note (Signed)
[facility] [address] [place], [id number]   ENDOSCOPY PROCEDURE REPORT  PATIENT: Pearletha FurlRegula, Kristi  MR#: [id number] BIRTHDATE: Aug 19, 1985 , 29  yrs. old GENDER: female ENDOSCOPIST: Rachael Feeaniel P Jacobs, MD PROCEDURE DATE:  04/14/2015 PROCEDURE:  EGD w/ biopsy ASA CLASS:     Class II INDICATIONS:  GERD, mild dysphasia. MEDICATIONS: Monitored anesthesia care and Propofol 150 mg IV TOPICAL ANESTHETIC: none  DESCRIPTION OF PROCEDURE: After the risks benefits and alternatives of the procedure were thoroughly explained, informed consent was obtained.  The LB [id number] endoscope was introduced through the mouth and advanced to the second portion of the duodenum , Without limitations.  The instrument was slowly withdrawn as the mucosa was fully examined.    There was mild, non-specific distal gastritis.  The stomach was biopsied (antrum and body) and sent to pathology.  The examination was otherwise normal.  Retroflexed views revealed no abnormalities. The scope was then withdrawn from the patient and the procedure completed.  COMPLICATIONS: There were no immediate complications.  ENDOSCOPIC IMPRESSION: There was mild, non-specific distal gastritis.  The stomach was biopsied (antrum and body) and sent to pathology.  The examination was otherwise normal  RECOMMENDATIONS: Continue the protonix once every morning and zantac at bedtime nightly.  If the biopsies show H.  pylori, you will be started on appropriate antibiotics.     Align (probiotic) was called into your pharmacy)  eSigned:  Rachael Feeaniel P Jacobs, MD 04/14/2015 10:31 AM

## 2015-04-14 NOTE — Progress Notes (Signed)
Report to PACU, RN, vss, BBS= Clear.  

## 2015-04-15 ENCOUNTER — Telehealth: Payer: Self-pay | Admitting: *Deleted

## 2015-04-15 NOTE — Telephone Encounter (Signed)
  Follow up Call-  Call back number 04/14/2015  Post procedure Call Back phone  # [phone]  Permission to leave phone message Yes     Patient questions:  Do you have a fever, pain , or abdominal swelling? No. Pain Score  0 *  Have you tolerated food without any problems? Yes.    Have you been able to return to your normal activities? Yes.    Do you have any questions about your discharge instructions: Diet   No. Medications  No. Follow up visit  No.  Do you have questions or concerns about your Care? No.  Actions: * If pain score is 4 or above: No action needed, pain <4.  Pt. Stated that she felt like she still air on her stomach,advised her drink hot fluids because that will help gut relax and get rid of air,she verbalized understanding.

## 2015-04-21 ENCOUNTER — Telehealth: Payer: Self-pay | Admitting: Gastroenterology

## 2015-04-21 NOTE — Telephone Encounter (Signed)
Pt aware that path results not available yet and she will be called as soon as Dr Christella HartiganJacobs reviews.

## 2015-04-23 ENCOUNTER — Encounter: Payer: Self-pay | Admitting: Gastroenterology

## 2015-04-24 ENCOUNTER — Telehealth: Payer: Self-pay | Admitting: Gastroenterology

## 2015-04-24 NOTE — Telephone Encounter (Signed)
The pt states she had a hard bowel movement and noticed a blood clot, pt was advised to watch for further bleeding or other signs or symptoms.  If the bleeding continues the pt will call back on Monday

## 2015-05-04 ENCOUNTER — Ambulatory Visit: Payer: 59 | Admitting: Gastroenterology

## 2015-05-25 ENCOUNTER — Telehealth: Payer: Self-pay | Admitting: Gastroenterology

## 2015-05-26 NOTE — Telephone Encounter (Signed)
Patient calling in again regarding this. Best # [phone]

## 2015-05-28 NOTE — Telephone Encounter (Signed)
Pt calling back in regarding previous messages

## 2015-05-28 NOTE — Telephone Encounter (Signed)
Pt had rectal bleeding 2 times this past week, she is back to normal now.  Appt with Lawson Fiscal on 06/17/15.  She will call if the bleeding returns or symptoms change.

## 2015-06-15 ENCOUNTER — Ambulatory Visit (INDEPENDENT_AMBULATORY_CARE_PROVIDER_SITE_OTHER): Payer: 59 | Admitting: Nurse Practitioner

## 2015-06-15 ENCOUNTER — Encounter: Payer: Self-pay | Admitting: Nurse Practitioner

## 2015-06-15 VITALS — BP 100/80 | HR 76 | Ht <= 58 in | Wt 125.1 lb

## 2015-06-15 DIAGNOSIS — R002 Palpitations: Secondary | ICD-10-CM | POA: Diagnosis not present

## 2015-06-15 NOTE — Progress Notes (Signed)
CARDIOLOGY OFFICE NOTE  Date:  06/15/2015    Kristi Robertson Date of Birth: 01/28/1986 Medical Record #[id number]  PCP:  Rubye Beach  Cardiologist:  Delton See    Chief Complaint  Patient presents with  . Palpitations    2 year check - seen for Dr. Delton See    History of Present Illness: Kristi Robertson is a 30 y.o. female who presents today for a work in visit. Seen for Dr. Delton See. She has no significant past medical history other than GERD.   Last seen in 2015 for palpitations. Negative Holter - just rare PACs noted.  Comes back today. Here alone today. She has had what she describes as a "spasm" under the left breast - comes and goes - nothing she can do to make it come and go. Not exertional. Reminds her of her gallbladder pain - this has been removed surgically. Sometimes will last for 30 minutes or more. No other associated symptoms. Does have just rare palpitation. Not dizzy or lightheaded. No syncope. This has been going on since November. She has been treated by PCP and sent to GI. She has GERD. Wonders if it is from anxiety/stress. Her father in law went missing last year - found 4 months later - ?suicide. Now with some layoffs at work - she is not directly affected. No routine exercise program.   Past Medical History  Diagnosis Date  . Acid reflux   . Anxiety   . PONV (postoperative nausea and vomiting)     severe  . Ovarian cyst     left side  . Headache     migraines, cluster  . Wears glasses   . Vitamin D deficiency     Past Surgical History  Procedure Laterality Date  . Cyst removed from ovary Right july 2010  . Fallopian tube and ovary removed  july 2011  . Abdominal hysterectomy  October 20, 2011  . Excision to groin Left     I and D multiple times  . Inguinal hernia repair N/A 07/12/2013    Procedure: EXCISION HIDRADENITIS GROIN;  Surgeon: Robyne Askew, MD;  Location: [facility];  Service: General;  Laterality: N/A;  . Cholecystectomy N/A  03/27/2014    Procedure: LAPAROSCOPIC CHOLECYSTECTOMY WITH INTRAOPERATIVE CHOLANGIOGRAM;  Surgeon: Chevis Pretty III, MD;  Location: [facility];  Service: General;  Laterality: N/A;     Medications: Current Outpatient Prescriptions  Medication Sig Dispense Refill  . acetaminophen (TYLENOL) 500 MG tablet Take 1,000 mg by mouth every 6 (six) hours as needed for mild pain or moderate pain.    Marland Kitchen ALPRAZolam (XANAX) 0.5 MG tablet Take 0.5 mg by mouth at bedtime as needed for anxiety.     . bifidobacterium infantis (ALIGN) capsule Take 1 capsule by mouth daily. 30 capsule 11  . cyproheptadine (PERIACTIN) 4 MG tablet Take 4 mg by mouth once a week. For migraines    . ibuprofen (ADVIL,MOTRIN) 200 MG tablet Take 400 mg by mouth every 6 (six) hours as needed for headache.     . ondansetron (ZOFRAN ODT) 4 MG disintegrating tablet Take 1 tablet (4 mg total) by mouth every 8 (eight) hours as needed for nausea or vomiting. 20 tablet 0  . pantoprazole (PROTONIX) 40 MG tablet Take 1 tablet (40 mg total) by mouth daily. 90 tablet 3  . polyethylene glycol (MIRALAX / GLYCOLAX) packet Take 17 g by mouth as needed for mild constipation.    . ranitidine (ZANTAC) 150 MG  tablet Take 150 mg by mouth at bedtime.     . Vitamin D, Ergocalciferol, (DRISDOL) 50000 UNITS CAPS capsule Take 50,000 Units by mouth every 7 (seven) days.      No current facility-administered medications for this visit.    Allergies: Allergies  Allergen Reactions  . Amoxicillin Nausea And Vomiting  . Ancef [Cefazolin] Hives and Itching  . Bactrim [Sulfamethoxazole-Trimethoprim] Other (See Comments)    Heart racing  . Codeine Nausea And Vomiting  . Demerol [Meperidine] Nausea And Vomiting  . Keflex [Cephalexin] Other (See Comments)    Heart racing  . Sulfa Antibiotics Other (See Comments)    Heart racing    Social History: The patient  reports that she has never smoked. She has never used smokeless tobacco. She reports that she drinks alcohol.  She reports that she does not use illicit drugs.   Family History: The patient's family history includes Diabetes in her maternal aunt and maternal uncle; Hypercholesterolemia in her mother; Hypertension in her mother; Stroke in her maternal uncle. There is no history of Colon cancer, Colon polyps, Kidney disease, Esophageal cancer, or Gallbladder disease.   Review of Systems: Please see the history of present illness.   Otherwise, the review of systems is positive for none.   All other systems are reviewed and negative.   Physical Exam: VS:  BP 100/80 mmHg  Pulse 76  Ht 4' 9.5" (1.461 m)  Wt 125 lb 1.9 oz (56.754 kg)  BMI 26.59 kg/m2 .  BMI Body mass index is 26.59 kg/(m^2).  Wt Readings from Last 3 Encounters:  06/15/15 125 lb 1.9 oz (56.754 kg)  04/14/15 125 lb (56.7 kg)  04/02/15 125 lb 8 oz (56.926 kg)    General: Pleasant. Well developed, well nourished and in no acute distress.  HEENT: Normal. Neck: Supple, no JVD, carotid bruits, or masses noted.  Cardiac: Regular rate and rhythm. No murmurs, rubs, or gallops. No edema.  Respiratory:  Lungs are clear to auscultation bilaterally with normal work of breathing.  GI: Soft and nontender.  MS: No deformity or atrophy. Gait and ROM intact. Skin: Warm and dry. Color is normal.  Neuro:  Strength and sensation are intact and no gross focal deficits noted.  Psych: Alert, appropriate and with normal affect.   LABORATORY DATA:  EKG:  EKG is ordered today. This demonstrates NSR - no acute changes.  Lab Results  Component Value Date   WBC 5.6 03/20/2014   HGB 13.4 03/20/2014   HCT 38.8 03/20/2014   PLT 272 03/20/2014   GLUCOSE 92 10/24/2014   ALT 20 10/24/2014   AST 20 10/24/2014   NA 139 10/24/2014   K 4.1 10/24/2014   CL 103 10/24/2014   CREATININE 0.76 10/24/2014   BUN 9 10/24/2014   CO2 30 10/24/2014   TSH 1.66 04/02/2013    BNP (last 3 results) No results for input(s): BNP in the last 8760 hours.  ProBNP (last  3 results) No results for input(s): PROBNP in the last 8760 hours.   Other Studies Reviewed Today:   Assessment/Plan: 1. Atypical chest pain - no real risk factors - does not smoke, no real +FH. EKG ok. Will arrange for GXT.   2. GERD  3. Situational stress  Current medicines are reviewed with the patient today.  The patient does not have concerns regarding medicines other than what has been noted above.  The following changes have been made:  See above.  Labs/ tests ordered today include:  Orders Placed This Encounter  Procedures  . Exercise Tolerance Test  . EKG 12-Lead     Disposition:   FU with Dr. Delton See prn unless GXT abnormal.   Patient is agreeable to this plan and will call if any problems develop in the interim.   Signed: Rosalio Macadamia, RN, ANP-C 06/15/2015 9:36 AM  [facility] [address] [place], [place]  [address]  Phone: [phone] Fax: [phone]

## 2015-06-15 NOTE — Patient Instructions (Addendum)
We will be checking the following labs today - NONE   Medication Instructions:    Continue with your current medicines.     Testing/Procedures To Be Arranged:  N/A  Follow-Up:   GXT    Other Special Instructions:   N/A    If you need a refill on your cardiac medications before your next appointment, please call your pharmacy.   Call the [facility] office at [phone] if you have any questions, problems or concerns.

## 2015-06-17 ENCOUNTER — Ambulatory Visit (INDEPENDENT_AMBULATORY_CARE_PROVIDER_SITE_OTHER): Payer: 59 | Admitting: Physician Assistant

## 2015-06-17 ENCOUNTER — Encounter: Payer: Self-pay | Admitting: Physician Assistant

## 2015-06-17 VITALS — BP 102/58 | HR 72 | Resp 14 | Ht <= 58 in | Wt 127.6 lb

## 2015-06-17 DIAGNOSIS — K648 Other hemorrhoids: Secondary | ICD-10-CM | POA: Diagnosis not present

## 2015-06-17 DIAGNOSIS — K625 Hemorrhage of anus and rectum: Secondary | ICD-10-CM | POA: Diagnosis not present

## 2015-06-17 DIAGNOSIS — K644 Residual hemorrhoidal skin tags: Secondary | ICD-10-CM

## 2015-06-17 MED ORDER — HYDROCORTISONE ACETATE 25 MG RE SUPP: 25.0000 mg | Freq: Two times a day (BID) | RECTAL | Status: DC

## 2015-06-17 NOTE — Patient Instructions (Signed)
We have sent the following medications to your pharmacy for you to pick up at your convenience:  Anusol suppositories 1 per rectum twice a day for 10 days.   Use Tucks wipes.   Hemorrhoids Hemorrhoids are swollen veins around the rectum or anus. There are two types of hemorrhoids:   Internal hemorrhoids. These occur in the veins just inside the rectum. They may poke through to the outside and become irritated and painful.  External hemorrhoids. These occur in the veins outside the anus and can be felt as a painful swelling or hard lump near the anus. CAUSES  Pregnancy.   Obesity.   Constipation or diarrhea.   Straining to have a bowel movement.   Sitting for long periods on the toilet.  Heavy lifting or other activity that caused you to strain.  Anal intercourse. SYMPTOMS   Pain.   Anal itching or irritation.   Rectal bleeding.   Fecal leakage.   Anal swelling.   One or more lumps around the anus.  DIAGNOSIS  Your caregiver may be able to diagnose hemorrhoids by visual examination. Other examinations or tests that may be performed include:   Examination of the rectal area with a gloved hand (digital rectal exam).   Examination of anal canal using a small tube (scope).   A blood test if you have lost a significant amount of blood.  A test to look inside the colon (sigmoidoscopy or colonoscopy). TREATMENT Most hemorrhoids can be treated at home. However, if symptoms do not seem to be getting better or if you have a lot of rectal bleeding, your caregiver may perform a procedure to help make the hemorrhoids get smaller or remove them completely. Possible treatments include:   Placing a rubber band at the base of the hemorrhoid to cut off the circulation (rubber band ligation).   Injecting a chemical to shrink the hemorrhoid (sclerotherapy).   Using a tool to burn the hemorrhoid (infrared light therapy).   Surgically removing the hemorrhoid  (hemorrhoidectomy).   Stapling the hemorrhoid to block blood flow to the tissue (hemorrhoid stapling).  HOME CARE INSTRUCTIONS   Eat foods with fiber, such as whole grains, beans, nuts, fruits, and vegetables. Ask your doctor about taking products with added fiber in them (fibersupplements).  Increase fluid intake. Drink enough water and fluids to keep your urine clear or pale yellow.   Exercise regularly.   Go to the bathroom when you have the urge to have a bowel movement. Do not wait.   Avoid straining to have bowel movements.   Keep the anal area dry and clean. Use wet toilet paper or moist towelettes after a bowel movement.   Medicated creams and suppositories may be used or applied as directed.   Only take over-the-counter or prescription medicines as directed by your caregiver.   Take warm sitz baths for 15-20 minutes, 3-4 times a day to ease pain and discomfort.   Place ice packs on the hemorrhoids if they are tender and swollen. Using ice packs between sitz baths may be helpful.   Put ice in a plastic bag.   Place a towel between your skin and the bag.   Leave the ice on for 15-20 minutes, 3-4 times a day.   Do not use a donut-shaped pillow or sit on the toilet for long periods. This increases blood pooling and pain.  SEEK MEDICAL CARE IF:  You have increasing pain and swelling that is not controlled by treatment or medicine.  You have uncontrolled bleeding.  You have difficulty or you are unable to have a bowel movement.  You have pain or inflammation outside the area of the hemorrhoids. MAKE SURE YOU:  Understand these instructions.  Will watch your condition.  Will get help right away if you are not doing well or get worse.   This information is not intended to replace advice given to you by your health care provider. Make sure you discuss any questions you have with your health care provider.   Document Released: 04/01/2000 Document  Revised: 03/21/2012 Document Reviewed: 02/07/2012 Elsevier Interactive Patient Education [employer].

## 2015-06-17 NOTE — Progress Notes (Signed)
Patient ID: Kristi Robertson, female   DOB: 09/12/1985, 30 y.o.   MRN: [id number]     History of Present Illness: Norman Bier is a pleasant 30 year old female known to Dr. Christella Hartigan. She was last seen in December 2016. At that time she was having bloating. She was started on Protonix which has provided good relief. She had an upper endoscopy on 03/25/2015 that showed mild nonspecific distal gastritis. She had previously been evaluated by Doug Sou for rectal bleeding and was noted to have internal hemorrhoids. She had been given a trial of Anusol suppositories with good relief of her symptoms. About 2 weeks ago she developed a "GI bug" and had diarrhea for 4 days. By the end of the 4 days she was noting light on the toilet tissue with bowel movements. She did have a sensation of incomplete evacuation for several days. She is now moving her bowels regularly and has not had any further rectal bleeding but does intermittently have a sensation of incomplete evacuation.   Past Medical History  Diagnosis Date  . Acid reflux   . Anxiety   . PONV (postoperative nausea and vomiting)     severe  . Ovarian cyst     left side  . Headache     migraines, cluster  . Wears glasses   . Vitamin D deficiency     Past Surgical History  Procedure Laterality Date  . Cyst removed from ovary Right july 2010  . Fallopian tube and ovary removed  july 2011  . Abdominal hysterectomy  October 20, 2011  . Excision to groin Left     I and D multiple times  . Inguinal hernia repair N/A 07/12/2013    Procedure: EXCISION HIDRADENITIS GROIN;  Surgeon: Robyne Askew, MD;  Location: [facility];  Service: General;  Laterality: N/A;  . Cholecystectomy N/A 03/27/2014    Procedure: LAPAROSCOPIC CHOLECYSTECTOMY WITH INTRAOPERATIVE CHOLANGIOGRAM;  Surgeon: Chevis Pretty III, MD;  Location: [facility];  Service: General;  Laterality: N/A;   Family History  Problem Relation Age of Onset  . Hypertension Mother   . Hypercholesterolemia  Mother   . Stroke Maternal Uncle   . Colon cancer Neg Hx   . Colon polyps Neg Hx   . Kidney disease Neg Hx   . Diabetes Maternal Aunt   . Diabetes Maternal Uncle     x3  . Esophageal cancer Neg Hx   . Gallbladder disease Neg Hx    Social History  Substance Use Topics  . Smoking status: Never Smoker   . Smokeless tobacco: Never Used  . Alcohol Use: Yes     Comment: occasional   Current Outpatient Prescriptions  Medication Sig Dispense Refill  . acetaminophen (TYLENOL) 500 MG tablet Take 1,000 mg by mouth every 6 (six) hours as needed for mild pain or moderate pain.    Marland Kitchen ALPRAZolam (XANAX) 0.5 MG tablet Take 0.5 mg by mouth at bedtime as needed for anxiety.     . bifidobacterium infantis (ALIGN) capsule Take 1 capsule by mouth daily. 30 capsule 11  . cyproheptadine (PERIACTIN) 4 MG tablet Take 4 mg by mouth once a week. For migraines    . ibuprofen (ADVIL,MOTRIN) 200 MG tablet Take 400 mg by mouth every 6 (six) hours as needed for headache.     . ondansetron (ZOFRAN ODT) 4 MG disintegrating tablet Take 1 tablet (4 mg total) by mouth every 8 (eight) hours as needed for nausea or vomiting. 20 tablet 0  .  pantoprazole (PROTONIX) 40 MG tablet Take 1 tablet (40 mg total) by mouth daily. 90 tablet 3  . polyethylene glycol (MIRALAX / GLYCOLAX) packet Take 17 g by mouth as needed for mild constipation.    . polyethylene glycol powder (GLYCOLAX/MIRALAX) powder     . ranitidine (ZANTAC) 150 MG tablet Take 150 mg by mouth at bedtime.     . Vitamin D, Ergocalciferol, (DRISDOL) 50000 UNITS CAPS capsule Take 50,000 Units by mouth every 7 (seven) days.     . hydrocortisone (ANUSOL-HC) 25 MG suppository Place 1 suppository (25 mg total) rectally 2 (two) times daily. 20 suppository 1   No current facility-administered medications for this visit.   Allergies  Allergen Reactions  . Amoxicillin Nausea And Vomiting  . Ancef [Cefazolin] Hives and Itching  . Bactrim [Sulfamethoxazole-Trimethoprim]  Other (See Comments)    Heart racing  . Codeine Nausea And Vomiting  . Demerol [Meperidine] Nausea And Vomiting  . Keflex [Cephalexin] Other (See Comments)    Heart racing  . Sulfa Antibiotics Other (See Comments)    Heart racing     Review of Systems: Gen: Denies any fever, chills, sweats, anorexia, fatigue, weakness, malaise, weight loss, and sleep disorder CV: Denies chest pain, angina, palpitations, syncope, orthopnea, PND, peripheral edema, and claudication. Resp: Denies dyspnea at rest, dyspnea with exercise, cough, sputum, wheezing, coughing up blood, and pleurisy. GI: Denies vomiting blood, jaundice, and fecal incontinence.   Denies dysphagia or odynophagia. GU : Denies urinary burning, blood in urine, urinary frequency, urinary hesitancy, nocturnal urination, and urinary incontinence. MS: Denies joint pain, limitation of movement, and swelling, stiffness, low back pain, extremity pain. Denies muscle weakness, cramps, atrophy.  Derm: Denies rash, itching, dry skin, hives, moles, warts, or unhealing ulcers.  Psych: Denies depression, anxiety, memory loss, suicidal ideation, hallucinations, paranoia, and confusion. Heme: Denies bruising, bleeding, and enlarged lymph nodes. Neuro:  Denies any headaches, dizziness, paresthesia Endo:  Denies any problems with DM, thyroid, adrenal    Physical Exam: BP 102/58 mmHg  Pulse 72  Resp 14  Ht 4' 9.5" (1.461 m)  Wt 127 lb 9.6 oz (57.879 kg)  BMI 27.12 kg/m2 General: Pleasant, well developed , female in no acute distress Head: Normocephalic and atraumatic Eyes:  sclerae anicteric, conjunctiva pink  Ears: Normal auditory acuity Lungs: Clear throughout to auscultation Heart: Regular rate and rhythm Abdomen: Soft, non distended, non-tender. No masses, no hepatomegaly. Normal bowel sounds Rectal: 2 small nonbleeding external hemorrhoids noted. Brown stool test Hemoccult negative. Anoscopy with posterior and lateral internal hemorrhoids  with no active bleeding. Musculoskeletal: Symmetrical with no gross deformities  Extremities: No edema  Neurological: Alert oriented x 4, grossly nonfocal Psychological:  Alert and cooperative. Normal mood and affect  Assessment and Recommendations:  30 year old female status post a bout of rectal bleeding likely hemorrhoidal in nature. She's been instructed to use Tucks wipes as needed. She has been provided with a prescription for Anusol HC suppositories to use one per rectum twice daily for 10 days. She will follow-up as needed.       Santez Woodcox, Tollie Pizza PA-C 06/17/2015,

## 2015-06-18 NOTE — Progress Notes (Signed)
i agree with the above note, plan 

## 2015-06-25 ENCOUNTER — Ambulatory Visit (INDEPENDENT_AMBULATORY_CARE_PROVIDER_SITE_OTHER): Payer: 59

## 2015-06-25 DIAGNOSIS — R002 Palpitations: Secondary | ICD-10-CM

## 2015-06-25 LAB — EXERCISE TOLERANCE TEST
Estimated workload: 10.1 METS
Exercise duration (min): 9 min
Exercise duration (sec): 0 s
MPHR: 190 {beats}/min
Peak HR: 176 {beats}/min
Percent HR: 92 %
RPE: 16
Rest HR: 83 {beats}/min

## 2015-07-17 ENCOUNTER — Ambulatory Visit
Admission: RE | Admit: 2015-07-17 | Discharge: 2015-07-17 | Disposition: A | Discharge status: Home / Self Care | Payer: 59 | Source: Ambulatory Visit | Attending: Infectious Diseases | Admitting: Infectious Diseases

## 2015-07-17 ENCOUNTER — Other Ambulatory Visit: Payer: Self-pay | Admitting: Infectious Diseases

## 2015-07-17 DIAGNOSIS — R109 Unspecified abdominal pain: Secondary | ICD-10-CM

## 2015-11-18 ENCOUNTER — Ambulatory Visit
Admission: RE | Admit: 2015-11-18 | Discharge: 2015-11-18 | Disposition: A | Discharge status: Home / Self Care | Payer: 59 | Source: Ambulatory Visit | Attending: Physician Assistant | Admitting: Physician Assistant

## 2015-11-18 ENCOUNTER — Other Ambulatory Visit: Payer: Self-pay | Admitting: Physician Assistant

## 2015-11-18 DIAGNOSIS — R1031 Right lower quadrant pain: Secondary | ICD-10-CM

## 2015-11-27 DIAGNOSIS — R110 Nausea: Secondary | ICD-10-CM | POA: Insufficient documentation

## 2016-07-19 DIAGNOSIS — G43009 Migraine without aura, not intractable, without status migrainosus: Secondary | ICD-10-CM | POA: Insufficient documentation

## 2016-07-27 DIAGNOSIS — G2581 Restless legs syndrome: Secondary | ICD-10-CM | POA: Insufficient documentation

## 2016-08-04 DIAGNOSIS — Z86010 Personal history of colonic polyps: Secondary | ICD-10-CM | POA: Insufficient documentation

## 2016-09-15 DIAGNOSIS — L989 Disorder of the skin and subcutaneous tissue, unspecified: Secondary | ICD-10-CM | POA: Insufficient documentation

## 2017-03-17 ENCOUNTER — Other Ambulatory Visit: Payer: Self-pay | Admitting: Physician Assistant

## 2017-03-17 ENCOUNTER — Ambulatory Visit
Admission: RE | Admit: 2017-03-17 | Discharge: 2017-03-17 | Disposition: A | Discharge status: Home / Self Care | Payer: 59 | Source: Ambulatory Visit | Attending: Physician Assistant | Admitting: Physician Assistant

## 2017-03-17 DIAGNOSIS — R1031 Right lower quadrant pain: Secondary | ICD-10-CM

## 2017-03-25 DIAGNOSIS — G4700 Insomnia, unspecified: Secondary | ICD-10-CM | POA: Insufficient documentation

## 2017-04-07 ENCOUNTER — Other Ambulatory Visit: Payer: Self-pay | Admitting: Physician Assistant

## 2017-04-07 DIAGNOSIS — N644 Mastodynia: Secondary | ICD-10-CM

## 2017-04-14 ENCOUNTER — Ambulatory Visit
Admission: RE | Admit: 2017-04-14 | Discharge: 2017-04-14 | Disposition: A | Discharge status: Home / Self Care | Payer: 59 | Source: Ambulatory Visit | Attending: Physician Assistant | Admitting: Physician Assistant

## 2017-04-14 ENCOUNTER — Ambulatory Visit: Payer: 59

## 2017-04-14 DIAGNOSIS — N644 Mastodynia: Secondary | ICD-10-CM

## 2017-12-25 DIAGNOSIS — H5500 Unspecified nystagmus: Secondary | ICD-10-CM | POA: Insufficient documentation

## 2017-12-29 ENCOUNTER — Other Ambulatory Visit: Payer: Self-pay | Admitting: Neurology

## 2017-12-29 DIAGNOSIS — H5500 Unspecified nystagmus: Secondary | ICD-10-CM

## 2018-01-04 ENCOUNTER — Ambulatory Visit
Admission: RE | Admit: 2018-01-04 | Discharge: 2018-01-04 | Disposition: A | Discharge status: Home / Self Care | Payer: 59 | Source: Ambulatory Visit | Attending: Neurology | Admitting: Neurology

## 2018-01-04 DIAGNOSIS — H5500 Unspecified nystagmus: Secondary | ICD-10-CM

## 2018-01-04 MED ORDER — GADOBENATE DIMEGLUMINE 529 MG/ML IV SOLN
10.0000 mL | Freq: Once | INTRAVENOUS | Status: AC | PRN
  Administered 2018-01-04: 10 mL via INTRAVENOUS

## 2018-01-25 ENCOUNTER — Other Ambulatory Visit: Payer: Self-pay | Admitting: Neurology

## 2018-01-25 DIAGNOSIS — R002 Palpitations: Secondary | ICD-10-CM

## 2018-02-12 ENCOUNTER — Ambulatory Visit (INDEPENDENT_AMBULATORY_CARE_PROVIDER_SITE_OTHER): Payer: 59

## 2018-02-12 DIAGNOSIS — R002 Palpitations: Secondary | ICD-10-CM

## 2018-02-15 ENCOUNTER — Telehealth: Payer: Self-pay | Admitting: *Deleted

## 2018-02-15 NOTE — Telephone Encounter (Signed)
Explained to patient we are calling all patients that have holter monitors. Her monitor was to be removed on 02/14/18 and she was asked to return holter monitor asap.  Patient states she will be returning monitor this afternoon.  Thanked patient for her consideration.

## 2018-11-14 ENCOUNTER — Other Ambulatory Visit: Payer: Self-pay | Admitting: Physician Assistant

## 2018-11-14 DIAGNOSIS — N6320 Unspecified lump in the left breast, unspecified quadrant: Secondary | ICD-10-CM

## 2018-11-23 ENCOUNTER — Ambulatory Visit
Admission: RE | Admit: 2018-11-23 | Discharge: 2018-11-23 | Disposition: A | Discharge status: Home / Self Care | Payer: BC Managed Care – PPO | Source: Ambulatory Visit | Attending: Physician Assistant | Admitting: Physician Assistant

## 2018-11-23 ENCOUNTER — Other Ambulatory Visit: Payer: Self-pay

## 2018-11-23 ENCOUNTER — Ambulatory Visit
Admission: RE | Admit: 2018-11-23 | Discharge: 2018-11-23 | Disposition: A | Discharge status: Home / Self Care | Payer: 59 | Source: Ambulatory Visit | Attending: Physician Assistant | Admitting: Physician Assistant

## 2018-11-23 DIAGNOSIS — N6320 Unspecified lump in the left breast, unspecified quadrant: Secondary | ICD-10-CM

## 2020-05-30 ENCOUNTER — Emergency Department (HOSPITAL_BASED_OUTPATIENT_CLINIC_OR_DEPARTMENT_OTHER): Payer: BC Managed Care – PPO

## 2020-05-30 ENCOUNTER — Other Ambulatory Visit: Payer: Self-pay

## 2020-05-30 ENCOUNTER — Emergency Department (HOSPITAL_BASED_OUTPATIENT_CLINIC_OR_DEPARTMENT_OTHER)
Admission: EM | Admit: 2020-05-30 | Discharge: 2020-05-31 | Disposition: A | Discharge status: Home / Self Care | Payer: BC Managed Care – PPO | Attending: Emergency Medicine | Admitting: Emergency Medicine

## 2020-05-30 ENCOUNTER — Encounter (HOSPITAL_BASED_OUTPATIENT_CLINIC_OR_DEPARTMENT_OTHER): Payer: Self-pay | Admitting: Emergency Medicine

## 2020-05-30 DIAGNOSIS — R1011 Right upper quadrant pain: Secondary | ICD-10-CM

## 2020-05-30 DIAGNOSIS — K219 Gastro-esophageal reflux disease without esophagitis: Secondary | ICD-10-CM | POA: Insufficient documentation

## 2020-05-30 DIAGNOSIS — R110 Nausea: Secondary | ICD-10-CM | POA: Diagnosis not present

## 2020-05-30 LAB — COMPREHENSIVE METABOLIC PANEL
ALT: 32 U/L (ref 0–44)
AST: 25 U/L (ref 15–41)
Albumin: 4.5 g/dL (ref 3.5–5.0)
Alkaline Phosphatase: 95 U/L (ref 38–126)
Anion gap: 11 (ref 5–15)
BUN: 7 mg/dL (ref 6–20)
CO2: 23 mmol/L (ref 22–32)
Calcium: 9.2 mg/dL (ref 8.9–10.3)
Chloride: 101 mmol/L (ref 98–111)
Creatinine, Ser: 0.78 mg/dL (ref 0.44–1.00)
GFR, Estimated: >60 mL/min (ref >60)
Glucose, Bld: 107 mg/dL — ABNORMAL HIGH (ref 70–99)
Potassium: 3.2 mmol/L — ABNORMAL LOW (ref 3.5–5.1)
Sodium: 135 mmol/L (ref 135–145)
Total Bilirubin: 0.5 mg/dL (ref 0.3–1.2)
Total Protein: 8.4 g/dL — ABNORMAL HIGH (ref 6.5–8.1)

## 2020-05-30 LAB — CBC
HCT: 39.9 % (ref 36.0–46.0)
Hemoglobin: 14.3 g/dL (ref 12.0–15.0)
MCH: 30.6 pg (ref 26.0–34.0)
MCHC: 35.8 g/dL (ref 30.0–36.0)
MCV: 85.4 fL (ref 80.0–100.0)
Platelets: 375 10*3/uL (ref 150–400)
RBC: 4.67 MIL/uL (ref 3.87–5.11)
RDW: 12.8 % (ref 11.5–15.5)
WBC: 14.7 10*3/uL — ABNORMAL HIGH (ref 4.0–10.5)
nRBC: 0 % (ref 0.0–0.2)

## 2020-05-30 LAB — URINALYSIS, ROUTINE W REFLEX MICROSCOPIC
Bilirubin Urine: NEGATIVE
Glucose, UA: NEGATIVE mg/dL
Hgb urine dipstick: NEGATIVE
Ketones, ur: NEGATIVE mg/dL
Leukocytes,Ua: NEGATIVE
Nitrite: NEGATIVE
Protein, ur: NEGATIVE mg/dL
Specific Gravity, Urine: 1.005 (ref 1.005–1.030)
pH: 6 (ref 5.0–8.0)

## 2020-05-30 LAB — LIPASE, BLOOD: Lipase: 27 U/L (ref 11–51)

## 2020-05-30 LAB — PREGNANCY, URINE: Preg Test, Ur: NEGATIVE

## 2020-05-30 MED ORDER — ONDANSETRON HCL 4 MG/2ML IJ SOLN
4.0000 mg | Freq: Once | INTRAMUSCULAR | Status: AC
  Administered 2020-05-30: 4 mg via INTRAVENOUS
  Filled 2020-05-30: qty 2

## 2020-05-30 MED ORDER — SODIUM CHLORIDE 0.9 % IV BOLUS
1000.0000 mL | Freq: Once | INTRAVENOUS | Status: AC
  Administered 2020-05-30: 1000 mL via INTRAVENOUS

## 2020-05-30 NOTE — ED Provider Notes (Signed)
Emergency Department Provider Note   I have reviewed the triage vital signs and the nursing notes.   HISTORY  Chief Complaint Abdominal Pain   HPI Kristi Robertson is a 35 y.o. female with past medical history reviewed below including prior cholecystectomy presents to the emergency department for evaluation of right upper quadrant abdominal and flank pain.  Patient had a similar pain in December that resolved without acute intervention.  She notes that pain returned with some associated nausea but no vomiting.  No diarrhea.  No fevers.  No chest pain or shortness of breath.  She had pain starting yesterday but worsening tonight.  She consulted a virtual physician who advised that she present to the ED for further evaluation.   Past Medical History:  Diagnosis Date  . Acid reflux   . Anxiety   . Headache    migraines, cluster  . Ovarian cyst    left side  . PONV (postoperative nausea and vomiting)    severe  . Vitamin D deficiency   . Wears glasses     Patient Active Problem List   Diagnosis Date Noted  . Rectal bleeding 02/12/2015  . Internal hemorrhoids 02/12/2015  . Hidradenitis 06/21/2013  . Palpitations 04/03/2013    Past Surgical History:  Procedure Laterality Date  . ABDOMINAL HYSTERECTOMY  October 20, 2011  . CHOLECYSTECTOMY N/A 03/27/2014   Procedure: LAPAROSCOPIC CHOLECYSTECTOMY WITH INTRAOPERATIVE CHOLANGIOGRAM;  Surgeon: Chevis Pretty III, MD;  Location: [facility];  Service: General;  Laterality: N/A;  . cyst removed from ovary Right july 2010  . excision to groin Left    I and D multiple times  . fallopian tube and ovary removed  july 2011  . INGUINAL HERNIA REPAIR N/A 07/12/2013   Procedure: EXCISION HIDRADENITIS GROIN;  Surgeon: Robyne Askew, MD;  Location: [facility];  Service: General;  Laterality: N/A;    Allergies Amoxicillin, Ancef [cefazolin], Bactrim [sulfamethoxazole-trimethoprim], Codeine, Demerol [meperidine], Keflex [cephalexin], Sulfa antibiotics,  and Topiramate  Family History  Problem Relation Age of Onset  . Hypertension Mother   . Hypercholesterolemia Mother   . Stroke Maternal Uncle   . Diabetes Maternal Aunt   . Diabetes Maternal Uncle        x3  . Colon cancer Neg Hx   . Colon polyps Neg Hx   . Kidney disease Neg Hx   . Esophageal cancer Neg Hx   . Gallbladder disease Neg Hx     Social History Social History   Tobacco Use  . Smoking status: Never Smoker  . Smokeless tobacco: Never Used  Substance Use Topics  . Alcohol use: Yes    Comment: occasional  . Drug use: No    Review of Systems  Constitutional: No fever/chills Eyes: No visual changes. ENT: No sore throat. Cardiovascular: Denies chest pain. Respiratory: Denies shortness of breath. Gastrointestinal: Positive abdominal pain. Positive nausea, no vomiting.  No diarrhea.  No constipation. Genitourinary: Negative for dysuria. Musculoskeletal: Negative for back pain. Skin: Negative for rash. Neurological: Negative for headaches, focal weakness or numbness.  10-point ROS otherwise negative.  ____________________________________________   PHYSICAL EXAM:  VITAL SIGNS: ED Triage Vitals  Enc Vitals Group     BP 05/30/20 2050 (!) 141/94     Pulse Rate 05/30/20 2050 (!) 119     Resp 05/30/20 2050 18     Temp 05/30/20 2050 98.3 F (36.8 C)     Temp Source 05/30/20 2050 Oral     SpO2 05/30/20 2050  98 %     Weight 05/30/20 2051 155 lb (70.3 kg)     Height 05/30/20 2051 4\' 9"  (1.448 m)   Constitutional: Alert and oriented. Well appearing and in no acute distress. Eyes: Conjunctivae are normal. Head: Atraumatic. Nose: No congestion/rhinnorhea. Mouth/Throat: Mucous membranes are moist. Neck: No stridor.   Cardiovascular: Normal rate, regular rhythm. Good peripheral circulation. Grossly normal heart sounds.   Respiratory: Normal respiratory effort.  No retractions. Lungs CTAB. Gastrointestinal: Soft with mild RUQ tenderness. No rebound or  guarding. Minimal RLQ tenderness. No left sided abdominal tenderness. No distention.  Musculoskeletal: No gross deformities of extremities. Neurologic:  Normal speech and language. Skin:  Skin is warm, dry and intact. No rash noted.   ____________________________________________   LABS (all labs ordered are listed, but only abnormal results are displayed)  Labs Reviewed  COMPREHENSIVE METABOLIC PANEL - Abnormal; Notable for the following components:      Result Value   Potassium 3.2 (*)    Glucose, Bld 107 (*)    Total Protein 8.4 (*)    All other components within normal limits  CBC - Abnormal; Notable for the following components:   WBC 14.7 (*)    All other components within normal limits  LIPASE, BLOOD  URINALYSIS, ROUTINE W REFLEX MICROSCOPIC  PREGNANCY, URINE  D-DIMER, QUANTITATIVE (NOT AT [facility])   ____________________________________________  RADIOLOGY  CT ABDOMEN PELVIS W CONTRAST  Result Date: 05/31/2020 CLINICAL DATA:  Acute right lower quadrant pain. EXAM: CT ABDOMEN AND PELVIS WITH CONTRAST TECHNIQUE: Multidetector CT imaging of the abdomen and pelvis was performed using the standard protocol following bolus administration of intravenous contrast. CONTRAST:  06/02/2020 OMNIPAQUE IOHEXOL 300 MG/ML  SOLN COMPARISON:  CT 03/17/2017 FINDINGS: Lower chest: No consolidation or pleural fluid. Hepatobiliary: Lobulated 18 mm subcapsular low-density in the right hepatic lobe, series 2, image 16. This is grossly stable from prior exams, limited assessment on priors due to lack of IV contrast. Hemangioma is seen on 2015 ultrasound. Status post cholecystectomy. No biliary dilatation. Pancreas: No ductal dilatation or inflammation. Spleen: Calcified splenic granuloma.  Normal in size. Adrenals/Urinary Tract: Normal adrenal glands. No hydronephrosis or perinephric edema. Decompressed ureters. No visualized renal calculi. Urinary bladder is only minimally distended, grossly normal.  Stomach/Bowel: Normal appendix, best appreciated on coronal reformat series 5 images 20-26. No appendicitis. No terminal ileal inflammation. No bowel obstruction. No small or large bowel wall thickening or inflammatory change. The stomach is unremarkable. Vascular/Lymphatic: Normal caliber abdominal aorta. Patent portal vein. Incidental phleboliths in the right ovarian vein. No enlarged lymph nodes in the abdomen or pelvis. Reproductive: Hysterectomy. Normal size left ovary, similar in appearance to prior. Right ovary is not seen Other: No free air or ascites. Postsurgical change of the upper anterior abdominal wall. Musculoskeletal: There are no acute or suspicious osseous abnormalities. IMPRESSION: 1. Normal appendix. No acute abnormality in the abdomen/pelvis. 2. Incidental hepatic hemangioma. Electronically Signed   By: 01-17-1970 M.D.   On: 05/31/2020 01:24    ____________________________________________   PROCEDURES  Procedure(s) performed:   Procedures  None  ____________________________________________   INITIAL IMPRESSION / ASSESSMENT AND PLAN / ED COURSE  Pertinent labs & imaging results that were available during my care of the patient were reviewed by me and considered in my medical decision making (see chart for details).   Patient presents to the emergency department with right-sided abdominal pain mainly in the right upper quadrant.  She is status post cholecystectomy several years ago.  Pain does  not radiate up into the chest or give shortness of breath.  Patient is persistently tachycardic here which may be related to pain.  Will give IV fluids along with Zofran.  Patient does not want pain medicine at this time.  Plan to add on a D-dimer considering PE is a possibility given the location of pain and surgically absent gallbladder.   D-dimer is negative.  Patient is otherwise low risk for PE.  CT imaging of the abdomen and pelvis performed showing no acute findings.   Discussed this with the patient and plan for close PCP and possibly GI follow-up.  Called in medications to the pharmacy to help with symptoms.  Discussed ED return precautions in detail. ____________________________________________  FINAL CLINICAL IMPRESSION(S) / ED DIAGNOSES  Final diagnoses:  RUQ abdominal pain     MEDICATIONS GIVEN DURING THIS VISIT:  Medications  ondansetron (ZOFRAN) injection 4 mg (4 mg Intravenous Given 05/30/20 2332)  sodium chloride 0.9 % bolus 1,000 mL (0 mLs Intravenous Stopped 05/31/20 0053)  iohexol (OMNIPAQUE) 300 MG/ML solution 100 mL (100 mLs Intravenous Contrast Given 05/31/20 0104)     NEW OUTPATIENT MEDICATIONS STARTED DURING THIS VISIT:  Discharge Medication List as of 05/31/2020  2:04 AM    START taking these medications   Details  dicyclomine (BENTYL) 20 MG tablet Take 1 tablet (20 mg total) by mouth 3 (three) times daily as needed for spasms., Starting Sun 05/31/2020, Normal        Note:  This document was prepared using Dragon voice recognition software and may include unintentional dictation errors.  Alona Bene, MD, [facility] Emergency Medicine    Dillen Belmontes, Arlyss Repress, MD 05/31/20 [id number]

## 2020-05-30 NOTE — ED Triage Notes (Signed)
Pt reports RUQ pain since Nov; pt reports tonight she called the on call doctor and they told her to come in; pt reports pain starts in RUQ and radiates to her back; pt reports nausea no vomiting

## 2020-05-30 NOTE — ED Notes (Signed)
ED Provider at bedside. 

## 2020-05-31 ENCOUNTER — Encounter (HOSPITAL_BASED_OUTPATIENT_CLINIC_OR_DEPARTMENT_OTHER): Payer: Self-pay

## 2020-05-31 LAB — D-DIMER, QUANTITATIVE: D-Dimer, Quant: 0.48 ug/mL-FEU (ref 0.00–0.50)

## 2020-05-31 MED ORDER — DICYCLOMINE HCL 20 MG PO TABS: 20.0000 mg | ORAL_TABLET | Freq: Three times a day (TID) | ORAL | 0 refills | Status: DC | PRN

## 2020-05-31 MED ORDER — IOHEXOL 300 MG/ML  SOLN
100.0000 mL | Freq: Once | INTRAMUSCULAR | Status: AC | PRN
  Administered 2020-05-31: 100 mL via INTRAVENOUS

## 2020-05-31 MED ORDER — ONDANSETRON 4 MG PO TBDP: 4.0000 mg | ORAL_TABLET | Freq: Three times a day (TID) | ORAL | 0 refills | Status: DC | PRN

## 2020-05-31 NOTE — Discharge Instructions (Signed)

## 2020-05-31 NOTE — ED Notes (Signed)
Patient transported to CT 

## 2020-07-07 DIAGNOSIS — Z9889 Other specified postprocedural states: Secondary | ICD-10-CM | POA: Insufficient documentation

## 2020-07-07 DIAGNOSIS — E559 Vitamin D deficiency, unspecified: Secondary | ICD-10-CM | POA: Insufficient documentation

## 2020-07-07 DIAGNOSIS — F419 Anxiety disorder, unspecified: Secondary | ICD-10-CM | POA: Insufficient documentation

## 2020-07-07 DIAGNOSIS — N83209 Unspecified ovarian cyst, unspecified side: Secondary | ICD-10-CM | POA: Insufficient documentation

## 2020-07-07 DIAGNOSIS — R519 Headache, unspecified: Secondary | ICD-10-CM | POA: Insufficient documentation

## 2020-07-07 DIAGNOSIS — R112 Nausea with vomiting, unspecified: Secondary | ICD-10-CM | POA: Insufficient documentation

## 2020-07-07 DIAGNOSIS — Z973 Presence of spectacles and contact lenses: Secondary | ICD-10-CM | POA: Insufficient documentation

## 2020-07-10 ENCOUNTER — Encounter: Payer: Self-pay | Admitting: Cardiology

## 2020-07-10 ENCOUNTER — Ambulatory Visit (INDEPENDENT_AMBULATORY_CARE_PROVIDER_SITE_OTHER): Payer: BC Managed Care – PPO

## 2020-07-10 ENCOUNTER — Ambulatory Visit: Payer: BC Managed Care – PPO | Admitting: Cardiology

## 2020-07-10 ENCOUNTER — Other Ambulatory Visit: Payer: Self-pay

## 2020-07-10 VITALS — BP 124/88 | HR 92 | Ht <= 58 in | Wt 152.0 lb

## 2020-07-10 DIAGNOSIS — E669 Obesity, unspecified: Secondary | ICD-10-CM

## 2020-07-10 DIAGNOSIS — R002 Palpitations: Secondary | ICD-10-CM

## 2020-07-10 DIAGNOSIS — E559 Vitamin D deficiency, unspecified: Secondary | ICD-10-CM

## 2020-07-10 DIAGNOSIS — I491 Atrial premature depolarization: Secondary | ICD-10-CM | POA: Diagnosis not present

## 2020-07-10 NOTE — Patient Instructions (Signed)
Medication Instructions:  Your physician recommends that you continue on your current medications as directed. Please refer to the Current Medication list given to you today.  *If you need a refill on your cardiac medications before your next appointment, please call your pharmacy*   Lab Work: Your physician recommends that you return for lab work: TODAY: Vitamin D If you have labs (blood work) drawn today and your tests are completely normal, you will receive your results only by: Marland Kitchen MyChart Message (if you have MyChart) OR . A paper copy in the mail If you have any lab test that is abnormal or we need to change your treatment, we will call you to review the results.   Testing/Procedures: A zio monitor was ordered today. It will remain on for 14 days. You will then return monitor and event diary in provided box. It takes 1-2 weeks for report to be downloaded and returned to [place]. We will call you with the results. If monitor falls off or has orange flashing light, please call Zio for further instructions.     Follow-Up: At [facility], you and your health needs are our priority.  As part of our continuing mission to provide you with exceptional heart care, we have created designated Provider Care Teams.  These Care Teams include your primary Cardiologist (physician) and Advanced Practice Providers (APPs -  Physician Assistants and Nurse Practitioners) who all work together to provide you with the care you need, when you need it.  We recommend signing up for the patient portal called "MyChart".  Sign up information is provided on this After Visit Summary.  MyChart is used to connect with patients for Virtual Visits (Telemedicine).  Patients are able to view lab/test results, encounter notes, upcoming appointments, etc.  Non-urgent messages can be sent to your provider as well.   To learn more about what you can do with MyChart, go to [email or web address].    Your next appointment:   As  needed  The format for your next appointment:   In Person  Provider:   Thomasene Ripple, DO   Other Instructions

## 2020-07-10 NOTE — Progress Notes (Signed)
Cardiology Office Note:    Date:  07/10/2020   ID:  Kristi Robertson, DOB March 27, 1986, MRN [id number]  PCP:  Jamal Collin, PA-C  Cardiologist:  No primary care provider on file.  Electrophysiologist:  None   Referring MD: Jamal Collin, PA-C   -Having palpitations off and on  History of Present Illness:    Kristi Robertson is a 35 y.o. female with a hx of palpitations reappears in 2017 the patient reported that she did have these palpitations she underwent an exercise tolerance test which was normal, vitamin D deficiency is here today to be evaluated for palpitations.  Patient reported that she has been experiencing again abrupt onset of fast heartbeat which last for seconds at a time and resolved.  She denies any lightheadedness or dizziness associated with this. She went to the emergency department for upper abdominal quadrant pain she had an EKG which was sinus tachycardia with PACs.  Because of that she got concerned she came to cardiology to be evaluated. She denies any chest pain, any shortness of breath, any lightheadedness any dizziness.  Past Medical History:  Diagnosis Date  . Acid reflux   . Anxiety   . Headache    migraines, cluster  . Ovarian cyst    left side  . PONV (postoperative nausea and vomiting)    severe  . Vitamin D deficiency   . Wears glasses     Past Surgical History:  Procedure Laterality Date  . ABDOMINAL HYSTERECTOMY  October 20, 2011  . CHOLECYSTECTOMY N/A 03/27/2014   Procedure: LAPAROSCOPIC CHOLECYSTECTOMY WITH INTRAOPERATIVE CHOLANGIOGRAM;  Surgeon: Chevis Pretty III, MD;  Location: [facility] OR;  Service: General;  Laterality: N/A;  . cyst removed from ovary Right july 2010  . excision to groin Left    I and D multiple times  . fallopian tube and ovary removed  july 2011  . INGUINAL HERNIA REPAIR N/A 07/12/2013   Procedure: EXCISION HIDRADENITIS GROIN;  Surgeon: Robyne Askew, MD;  Location: [facility];  Service: General;  Laterality: N/A;     Current Medications: Current Meds  Medication Sig  . acetaminophen (TYLENOL) 500 MG tablet Take 1,000 mg by mouth every 6 (six) hours as needed for mild pain or moderate pain.  . bifidobacterium infantis (ALIGN) capsule Take 1 capsule by mouth daily.  . cetirizine (ZYRTEC) 10 MG tablet Take 1 tablet by mouth daily.  Marland Kitchen dicyclomine (BENTYL) 20 MG tablet Take 1 tablet (20 mg total) by mouth 3 (three) times daily as needed for spasms.  Marland Kitchen ibuprofen (ADVIL,MOTRIN) 200 MG tablet Take 400 mg by mouth every 6 (six) hours as needed for headache.   . methocarbamol (ROBAXIN) 500 MG tablet Take by mouth.  . ondansetron (ZOFRAN ODT) 4 MG disintegrating tablet Take 1 tablet (4 mg total) by mouth every 8 (eight) hours as needed.  . pantoprazole (PROTONIX) 40 MG tablet Take 1 tablet (40 mg total) by mouth daily.  . ranitidine (ZANTAC) 150 MG tablet Take 150 mg by mouth at bedtime.      Allergies:   Amoxicillin, Ancef [cefazolin], Bactrim [sulfamethoxazole-trimethoprim], Codeine, Demerol [meperidine], Keflex [cephalexin], Sulfa antibiotics, and Topiramate   Social History   Socioeconomic History  . Marital status: Married    Spouse name: Not on file  . Number of children: 2  . Years of education: Not on file  . Highest education level: Not on file  Occupational History  . Occupation: Designer, jewellery  Tobacco Use  . Smoking status: Never Smoker  .  Smokeless tobacco: Never Used  Substance and Sexual Activity  . Alcohol use: Yes    Comment: occasional  . Drug use: No  . Sexual activity: Yes    Birth control/protection: Surgical  Other Topics Concern  . Not on file  Social History Narrative  . Not on file   Social Determinants of Health   Financial Resource Strain: Not on file  Food Insecurity: Not on file  Transportation Needs: Not on file  Physical Activity: Not on file  Stress: Not on file  Social Connections: Not on file     Family History: The patient's family history includes  Diabetes in her maternal aunt and maternal uncle; Hypercholesterolemia in her mother; Hypertension in her mother; Stroke in her maternal uncle. There is no history of Colon cancer, Colon polyps, Kidney disease, Esophageal cancer, or Gallbladder disease.  ROS:   Review of Systems  Constitution: Negative for decreased appetite, fever and weight gain.  HENT: Negative for congestion, ear discharge, hoarse voice and sore throat.   Eyes: Negative for discharge, redness, vision loss in right eye and visual halos.  Cardiovascular: Report palpitations.  Negative for chest pain, dyspnea on exertion, leg swelling, orthopnea.Respiratory: Negative for cough, hemoptysis, shortness of breath and snoring.   Endocrine: Negative for heat intolerance and polyphagia.  Hematologic/Lymphatic: Negative for bleeding problem. Does not bruise/bleed easily.  Skin: Negative for flushing, nail changes, rash and suspicious lesions.  Musculoskeletal: Negative for arthritis, joint pain, muscle cramps, myalgias, neck pain and stiffness.  Gastrointestinal: Negative for abdominal pain, bowel incontinence, diarrhea and excessive appetite.  Genitourinary: Negative for decreased libido, genital sores and incomplete emptying.  Neurological: Negative for brief paralysis, focal weakness, headaches and loss of balance.  Psychiatric/Behavioral: Negative for altered mental status, depression and suicidal ideas.  Allergic/Immunologic: Negative for HIV exposure and persistent infections.    EKGs/Labs/Other Studies Reviewed:    The following studies were reviewed today:   EKG:  The ekg ordered today demonstrates sinus rhythm, heart rate 92 bpm with arrhythmia compared to EKG done on May 31, 2019 sinus tachycardia no longer exists there is no evidence of premature atrial complexes which was present on February 12.  Exercise tolerance test in 2017  There was no ST segment deviation noted during stress.  No T wave inversion was  noted during stress.  Negative, adequate stress test.  Normal exercise capacity.  Holter monitor worn for 48 hours in 2019 sinus arrhythmia was observed no supraventricular tachycardia no ventricular tachycardia was noted.  Recent Labs: 05/30/2020: ALT 32; BUN 7; Creatinine, Ser 0.78; Hemoglobin 14.3; Platelets 375; Potassium 3.2; Sodium 135  Recent Lipid Panel No results found for: CHOL, TRIG, HDL, CHOLHDL, VLDL, LDLCALC, LDLDIRECT  Physical Exam:    VS:  BP 124/88 (BP Location: Right Arm)   Pulse 92   Ht 4\' 9"  (1.448 m)   Wt 152 lb (68.9 kg)   SpO2 98%   BMI 32.89 kg/m     Wt Readings from Last 3 Encounters:  07/10/20 152 lb (68.9 kg)  05/30/20 155 lb (70.3 kg)  06/17/15 127 lb 9.6 oz (57.9 kg)     GEN: Well nourished, well developed in no acute distress HEENT: Normal NECK: No JVD; No carotid bruits LYMPHATICS: No lymphadenopathy CARDIAC: S1S2 noted,RRR, no murmurs, rubs, gallops RESPIRATORY:  Clear to auscultation without rales, wheezing or rhonchi  ABDOMEN: Soft, non-tender, non-distended, +bowel sounds, no guarding. EXTREMITIES: No edema, No cyanosis, no clubbing MUSCULOSKELETAL:  No deformity  SKIN: Warm and dry NEUROLOGIC:  Alert and oriented x 3, non-focal PSYCHIATRIC:  Normal affect, good insight  ASSESSMENT:    1. PAC (premature atrial contraction)   2. Palpitations   3. Obesity (BMI 30-39.9)    PLAN:     I would like to rule out a cardiovascular etiology of this palpitation, therefore at this time I would like to placed a zio patch for   14 days.  The recommendation will be made to review of her monitor.  The patient understands the need to lose weight with diet and exercise. We have discussed specific strategies for this.    The patient is in agreement with the above plan. The patient left the office in stable condition.  The patient will follow up as needed   Medication Adjustments/Labs and Tests Ordered: Current medicines are reviewed at length  with the patient today.  Concerns regarding medicines are outlined above.  No orders of the defined types were placed in this encounter.  No orders of the defined types were placed in this encounter.   There are no Patient Instructions on file for this visit.   Adopting a Healthy Lifestyle.  Know what a healthy weight is for you (roughly BMI <25) and aim to maintain this   Aim for 7+ servings of fruits and vegetables daily   65-80+ fluid ounces of water or unsweet tea for healthy kidneys   Limit to max 1 drink of alcohol per day; avoid smoking/tobacco   Limit animal fats in diet for cholesterol and heart health - choose grass fed whenever available   Avoid highly processed foods, and foods high in saturated/trans fats   Aim for low stress - take time to unwind and care for your mental health   Aim for 150 min of moderate intensity exercise weekly for heart health, and weights twice weekly for bone health   Aim for 7-9 hours of sleep daily   When it comes to diets, agreement about the perfect plan isnt easy to find, even among the experts. Experts at the [facility] of [employer] developed an idea known as the Healthy Eating Plate. Just imagine a plate divided into logical, healthy portions.   The emphasis is on diet quality:   Load up on vegetables and fruits - one-half of your plate: Aim for color and variety, and remember that potatoes dont count.   Go for whole grains - one-quarter of your plate: Whole wheat, barley, wheat berries, quinoa, oats, brown rice, and foods made with them. If you want pasta, go with whole wheat pasta.   Protein power - one-quarter of your plate: Fish, chicken, beans, and nuts are all healthy, versatile protein sources. Limit red meat.   The diet, however, does go beyond the plate, offering a few other suggestions.   Use healthy plant oils, such as olive, canola, soy, corn, sunflower and peanut. Check the labels, and avoid partially  hydrogenated oil, which have unhealthy trans fats.   If youre thirsty, drink water. Coffee and tea are good in moderation, but skip sugary drinks and limit milk and dairy products to one or two daily servings.   The type of carbohydrate in the diet is more important than the amount. Some sources of carbohydrates, such as vegetables, fruits, whole grains, and beans-are healthier than others.   Finally, stay active  Signed, Thomasene Ripple, DO  07/10/2020 3:23 PM    [facility]

## 2020-07-11 LAB — VITAMIN D 25 HYDROXY (VIT D DEFICIENCY, FRACTURES): Vit D, 25-Hydroxy: 30.5 ng/mL (ref 30.0–100.0)

## 2020-12-28 DIAGNOSIS — K219 Gastro-esophageal reflux disease without esophagitis: Secondary | ICD-10-CM | POA: Diagnosis not present

## 2020-12-28 DIAGNOSIS — Z86010 Personal history of colonic polyps: Secondary | ICD-10-CM | POA: Diagnosis not present

## 2020-12-28 DIAGNOSIS — R198 Other specified symptoms and signs involving the digestive system and abdomen: Secondary | ICD-10-CM | POA: Diagnosis not present

## 2021-01-23 DIAGNOSIS — K130 Diseases of lips: Secondary | ICD-10-CM | POA: Diagnosis not present

## 2021-02-01 DIAGNOSIS — K130 Diseases of lips: Secondary | ICD-10-CM | POA: Diagnosis not present

## 2021-03-08 DIAGNOSIS — Z0000 Encounter for general adult medical examination without abnormal findings: Secondary | ICD-10-CM | POA: Diagnosis not present

## 2021-03-08 DIAGNOSIS — E782 Mixed hyperlipidemia: Secondary | ICD-10-CM | POA: Diagnosis not present

## 2021-03-08 DIAGNOSIS — Z13220 Encounter for screening for lipoid disorders: Secondary | ICD-10-CM | POA: Diagnosis not present

## 2021-03-08 DIAGNOSIS — K130 Diseases of lips: Secondary | ICD-10-CM | POA: Diagnosis not present

## 2021-03-08 DIAGNOSIS — Z1329 Encounter for screening for other suspected endocrine disorder: Secondary | ICD-10-CM | POA: Diagnosis not present

## 2021-03-08 DIAGNOSIS — Z0001 Encounter for general adult medical examination with abnormal findings: Secondary | ICD-10-CM | POA: Diagnosis not present

## 2021-04-14 DIAGNOSIS — R300 Dysuria: Secondary | ICD-10-CM | POA: Diagnosis not present

## 2021-04-14 DIAGNOSIS — M25541 Pain in joints of right hand: Secondary | ICD-10-CM | POA: Diagnosis not present

## 2021-04-22 DIAGNOSIS — L732 Hidradenitis suppurativa: Secondary | ICD-10-CM | POA: Diagnosis not present

## 2021-04-22 DIAGNOSIS — L723 Sebaceous cyst: Secondary | ICD-10-CM | POA: Diagnosis not present

## 2021-07-19 DIAGNOSIS — M549 Dorsalgia, unspecified: Secondary | ICD-10-CM | POA: Diagnosis not present

## 2021-07-19 DIAGNOSIS — G43909 Migraine, unspecified, not intractable, without status migrainosus: Secondary | ICD-10-CM | POA: Diagnosis not present

## 2021-07-19 DIAGNOSIS — G4700 Insomnia, unspecified: Secondary | ICD-10-CM | POA: Diagnosis not present

## 2021-07-19 DIAGNOSIS — F419 Anxiety disorder, unspecified: Secondary | ICD-10-CM | POA: Diagnosis not present

## 2021-08-06 DIAGNOSIS — Z86010 Personal history of colonic polyps: Secondary | ICD-10-CM | POA: Diagnosis not present

## 2021-08-06 DIAGNOSIS — K219 Gastro-esophageal reflux disease without esophagitis: Secondary | ICD-10-CM | POA: Diagnosis not present

## 2021-08-06 DIAGNOSIS — R198 Other specified symptoms and signs involving the digestive system and abdomen: Secondary | ICD-10-CM | POA: Diagnosis not present

## 2021-09-13 ENCOUNTER — Ambulatory Visit
Admission: RE | Admit: 2021-09-13 | Discharge: 2021-09-13 | Disposition: A | Discharge status: Home / Self Care | Payer: BC Managed Care – PPO | Source: Ambulatory Visit | Attending: Physician Assistant | Admitting: Physician Assistant

## 2021-09-13 ENCOUNTER — Ambulatory Visit: Admit: 2021-09-13 | Payer: BC Managed Care – PPO

## 2021-09-13 VITALS — BP 139/82 | HR 106 | Temp 98.2°F | Resp 16

## 2021-09-13 DIAGNOSIS — L682 Localized hypertrichosis: Secondary | ICD-10-CM | POA: Diagnosis not present

## 2021-09-13 NOTE — ED Triage Notes (Signed)
Pt c/o left ear fullness and hearing crackling in the ear.  Started: yesterday

## 2021-09-13 NOTE — Discharge Instructions (Addendum)
Please follow-up with ENT to have it removed from left ear canal.

## 2021-09-13 NOTE — ED Provider Notes (Signed)
[facility]    CSN: [id number] Arrival date & time: 09/13/21  1429    HISTORY   Chief Complaint  Patient presents with   Ear Fullness    Can also hear a crackling in the ear - Entered by patient   HPI Kristi Robertson is a 36 y.o. female. Patient presents to urgent care complaining of sensation of fullness in her left ear and a crackling sound.  Patient states the symptoms are not new however the crackling sound is usually relieved with manipulation of her tragus however this is no longer working.  Patient states that the crackling sound is almost constant, is worse with yawning and swallowing.  Patient denies recent upper respiratory infection, ear pain, drainage from ear.  Patient denies known foreign body in her ear.  The history is provided by the patient.  Past Medical History:  Diagnosis Date   Acid reflux    Anxiety    Headache    migraines, cluster   Ovarian cyst    left side   PONV (postoperative nausea and vomiting)    severe   Vitamin D deficiency    Wears glasses    Patient Active Problem List   Diagnosis Date Noted   Anxiety    Headache    Ovarian cyst    PONV (postoperative nausea and vomiting)    Vitamin D deficiency    Wears glasses    Nystagmus 12/25/2017   Insomnia 03/25/2017   Benign skin lesion of face 09/15/2016   History of colon polyps 08/04/2016   Restless legs syndrome (RLS) 07/27/2016   Migraine without aura and without status migrainosus, not intractable 07/19/2016   Chronic nausea 11/27/2015   Rectal hemorrhage 02/12/2015   Hemorrhoids 02/12/2015   Gastroesophageal reflux disease without esophagitis 11/18/2014   Hidradenitis 06/21/2013   Palpitations 04/03/2013   Low back pain 04/03/2008   Generalized anxiety disorder 04/06/2007   Migraine, unspecified, not intractable, without status migrainosus 04/06/2007   Other and unspecified hyperlipidemia 02/28/2007   Past Surgical History:  Procedure Laterality Date   ABDOMINAL  HYSTERECTOMY  October 20, 2011   CHOLECYSTECTOMY N/A 03/27/2014   Procedure: LAPAROSCOPIC CHOLECYSTECTOMY WITH INTRAOPERATIVE CHOLANGIOGRAM;  Surgeon: Chevis Pretty III, MD;  Location: [facility] OR;  Service: General;  Laterality: N/A;   cyst removed from ovary Right july 2010   excision to groin Left    I and D multiple times   fallopian tube and ovary removed  july 2011   INGUINAL HERNIA REPAIR N/A 07/12/2013   Procedure: EXCISION HIDRADENITIS GROIN;  Surgeon: Robyne Askew, MD;  Location: [facility];  Service: General;  Laterality: N/A;   OB History     Gravida  1   Para      Term      Preterm      AB      Living  1      SAB      IAB      Ectopic      Multiple      Live Births             Home Medications    Prior to Admission medications   Medication Sig Start Date End Date Taking? Authorizing Provider  acetaminophen (TYLENOL) 500 MG tablet Take 1,000 mg by mouth every 6 (six) hours as needed for mild pain or moderate pain.    [provider]  bifidobacterium infantis (ALIGN) capsule Take 1 capsule by mouth daily. 04/14/15  Rachael Fee, MD  cetirizine (ZYRTEC) 10 MG tablet Take 1 tablet by mouth daily. 02/27/20   [provider]  dicyclomine (BENTYL) 20 MG tablet Take 1 tablet (20 mg total) by mouth 3 (three) times daily as needed for spasms. 05/31/20   Long, Arlyss Repress, MD  ibuprofen (ADVIL,MOTRIN) 200 MG tablet Take 400 mg by mouth every 6 (six) hours as needed for headache.     [provider]  methocarbamol (ROBAXIN) 500 MG tablet Take by mouth. 02/10/20   [provider]  ondansetron (ZOFRAN ODT) 4 MG disintegrating tablet Take 1 tablet (4 mg total) by mouth every 8 (eight) hours as needed. 05/31/20   Long, Arlyss Repress, MD  pantoprazole (PROTONIX) 40 MG tablet Take 1 tablet (40 mg total) by mouth daily. 06/11/14   Rachael Fee, MD  ranitidine (ZANTAC) 150 MG tablet Take 150 mg by mouth at bedtime.     [provider]    Family History Family History  Problem Relation Age of Onset   Hypertension Mother    Hypercholesterolemia Mother    Stroke Maternal Uncle    Diabetes Maternal Aunt    Diabetes Maternal Uncle        x3   Colon cancer Neg Hx    Colon polyps Neg Hx    Kidney disease Neg Hx    Esophageal cancer Neg Hx    Gallbladder disease Neg Hx    Social History Social History   Tobacco Use   Smoking status: Never   Smokeless tobacco: Never  Substance Use Topics   Alcohol use: Yes    Comment: occasional   Drug use: No   Allergies   Amoxicillin, Ancef [cefazolin], Bactrim [sulfamethoxazole-trimethoprim], Codeine, Demerol [meperidine], Keflex [cephalexin], Sulfa antibiotics, and Topiramate  Review of Systems Review of Systems Pertinent findings noted in history of present illness.   Physical Exam Triage Vital Signs ED Triage Vitals  Enc Vitals Group     BP 02/12/21 0827 (!) 147/82     Pulse Rate 02/12/21 0827 72     Resp 02/12/21 0827 18     Temp 02/12/21 0827 98.3 F (36.8 C)     Temp Source 02/12/21 0827 Oral     SpO2 02/12/21 0827 98 %     Weight --      Height --      Head Circumference --      Peak Flow --      Pain Score 02/12/21 0826 5     Pain Loc --      Pain Edu? --      Excl. in GC? --   No data found.  Updated Vital Signs BP 139/82 (BP Location: Right Arm)   Pulse (!) 106   Temp 98.2 F (36.8 C) (Oral)   Resp 16   SpO2 99%   Physical Exam Vitals and nursing note reviewed.  Constitutional:      General: She is not in acute distress.    Appearance: Normal appearance. She is not ill-appearing.  HENT:     Head: Normocephalic and atraumatic.     Salivary Glands: Right salivary gland is not diffusely enlarged or tender. Left salivary gland is not diffusely enlarged or tender.     Right Ear: Hearing, tympanic membrane, ear canal and external ear normal. No drainage. No middle ear effusion. There is no impacted cerumen. Tympanic membrane is not erythematous  or bulging.     Left Ear: Hearing, tympanic membrane, ear canal and  external ear normal. No drainage.  No middle ear effusion. There is no impacted cerumen. Tympanic membrane is not erythematous or bulging.     Ears:     Comments: There is a single hair attached at the entrance of her left ear canal at 4:00 spans the length of her ear canal, the tip is touching her left eardrum.    Nose: Nose normal. No nasal deformity, septal deviation, mucosal edema, congestion or rhinorrhea.     Right Turbinates: Not enlarged, swollen or pale.     Left Turbinates: Not enlarged, swollen or pale.     Right Sinus: No maxillary sinus tenderness or frontal sinus tenderness.     Left Sinus: No maxillary sinus tenderness or frontal sinus tenderness.     Mouth/Throat:     Lips: Pink. No lesions.     Mouth: Mucous membranes are moist. No oral lesions.     Pharynx: Oropharynx is clear. Uvula midline. No posterior oropharyngeal erythema or uvula swelling.     Tonsils: No tonsillar exudate. 0 on the right. 0 on the left.  Eyes:     General: Lids are normal.        Right eye: No discharge.        Left eye: No discharge.     Extraocular Movements: Extraocular movements intact.     Conjunctiva/sclera: Conjunctivae normal.     Right eye: Right conjunctiva is not injected.     Left eye: Left conjunctiva is not injected.  Neck:     Trachea: Trachea and phonation normal.  Cardiovascular:     Rate and Rhythm: Normal rate and regular rhythm.     Pulses: Normal pulses.     Heart sounds: Normal heart sounds. No murmur heard.   No friction rub. No gallop.  Pulmonary:     Effort: Pulmonary effort is normal. No accessory muscle usage, prolonged expiration or respiratory distress.     Breath sounds: Normal breath sounds. No stridor, decreased air movement or transmitted upper airway sounds. No decreased breath sounds, wheezing, rhonchi or rales.  Chest:     Chest wall: No tenderness.  Musculoskeletal:        General:  Normal range of motion.     Cervical back: Normal range of motion and neck supple. Normal range of motion.  Lymphadenopathy:     Cervical: No cervical adenopathy.  Skin:    General: Skin is warm and dry.     Findings: No erythema or rash.  Neurological:     General: No focal deficit present.     Mental Status: She is alert and oriented to person, place, and time.  Psychiatric:        Mood and Affect: Mood normal.        Behavior: Behavior normal.    Visual Acuity Right Eye Distance:   Left Eye Distance:   Bilateral Distance:    Right Eye Near:   Left Eye Near:    Bilateral Near:     UC Couse / Diagnostics / Procedures:    EKG  Radiology No results found.  Procedures Procedures (including critical care time)  UC Diagnoses / Final Clinical Impressions(s)   I have reviewed the triage vital signs and the nursing notes.  Pertinent labs & imaging results that were available during my care of the patient were reviewed by me and considered in my medical decision making (see chart for details).   Final diagnoses:  Hairy ear   Multiple attempts at removing or  at least everting the hair from right ear canal using a Q-tip and then a curette were unsuccessful at the hair from her ear canal.  Patient advised to follow-up with ENT where they can easily remove the hair with better instrumentation that we have here in urgent care.  Patient verbalized understanding and agreed to do so.  ED Prescriptions   None    PDMP not reviewed this encounter.  Pending results:  Labs Reviewed - No data to display  Medications Ordered in UC: Medications - No data to display  Disposition Upon Discharge:  Condition: stable for discharge home Home: take medications as prescribed; routine discharge instructions as discussed; follow up as advised.  Patient presented with an acute illness with associated systemic symptoms and significant discomfort requiring urgent management. In my opinion, this  is a condition that a prudent lay person (someone who possesses an average knowledge of health and medicine) may potentially expect to result in complications if not addressed urgently such as respiratory distress, impairment of bodily function or dysfunction of bodily organs.   Routine symptom specific, illness specific and/or disease specific instructions were discussed with the patient and/or caregiver at length.   As such, the patient has been evaluated and assessed, work-up was performed and treatment was provided in alignment with urgent care protocols and evidence based medicine.  Patient/parent/caregiver has been advised that the patient may require follow up for further testing and treatment if the symptoms continue in spite of treatment, as clinically indicated and appropriate.  If the patient was tested for COVID-19, Influenza and/or RSV, then the patient/parent/guardian was advised to isolate at home pending the results of his/her diagnostic coronavirus test and potentially longer if they're positive. I have also advised pt that if his/her COVID-19 test returns positive, it's recommended to self-isolate for at least 10 days after symptoms first appeared AND until fever-free for 24 hours without fever reducer AND other symptoms have improved or resolved. Discussed self-isolation recommendations as well as instructions for household member/close contacts as per the [facility] and [place] [employer]HS, and also gave patient the COVID packet with this information.  Patient/parent/caregiver has been advised to return to the [facility] or PCP in 3-5 days if no better; to PCP or the Emergency Department if new signs and symptoms develop, or if the current signs or symptoms continue to change or worsen for further workup, evaluation and treatment as clinically indicated and appropriate  The patient will follow up with their current PCP if and as advised. If the patient does not currently have a PCP we will assist them in  obtaining one.   The patient may need specialty follow up if the symptoms continue, in spite of conservative treatment and management, for further workup, evaluation, consultation and treatment as clinically indicated and appropriate.  Patient/parent/caregiver verbalized understanding and agreement of plan as discussed.  All questions were addressed during visit.  Please see discharge instructions below for further details of plan.  Discharge Instructions:   Discharge Instructions      Please follow-up with ENT to have it removed from left ear canal.      This office note has been dictated using Dragon speech recognition software.  Unfortunately, and despite my best efforts, this method of dictation can sometimes lead to occasional typographical or grammatical errors.  I apologize in advance if this occurs.     Theadora Rama Scales, PA-C 09/13/21 1542

## 2021-09-14 DIAGNOSIS — H93292 Other abnormal auditory perceptions, left ear: Secondary | ICD-10-CM | POA: Diagnosis not present

## 2021-09-14 DIAGNOSIS — T162XXA Foreign body in left ear, initial encounter: Secondary | ICD-10-CM | POA: Diagnosis not present

## 2021-10-21 DIAGNOSIS — K625 Hemorrhage of anus and rectum: Secondary | ICD-10-CM | POA: Diagnosis not present

## 2021-12-04 IMAGING — CT CT ABD-PELV W/ CM
2 of 4 series · 16 of 46 positions shown, 18 images · IV contrast (Omnipaque)
Comparison: CT 03/17/2017

CLINICAL DATA: Acute right lower quadrant pain.

EXAM:
CT ABDOMEN AND PELVIS WITH CONTRAST
TECHNIQUE: Multidetector CT imaging of the abdomen and pelvis was performed
using the standard protocol following bolus administration of
intravenous contrast.
CONTRAST:  100mL OMNIPAQUE IOHEXOL 300 MG/ML  SOLN

[Series 2: axial st · axial · 0.63mm/px · z∈[-439,-54]mm · 13 of 85 slices shown, 15 images]
[im 4/85  soft-tissue]
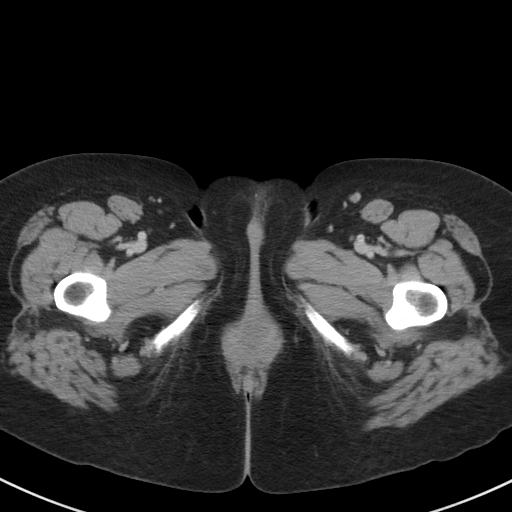
[im 4/85  bone]
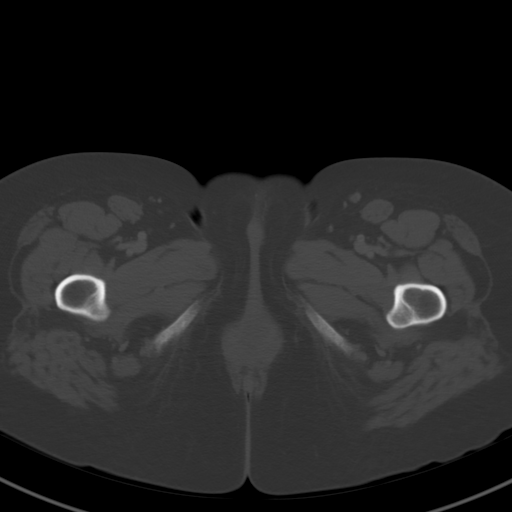
[im 11/85  soft-tissue]
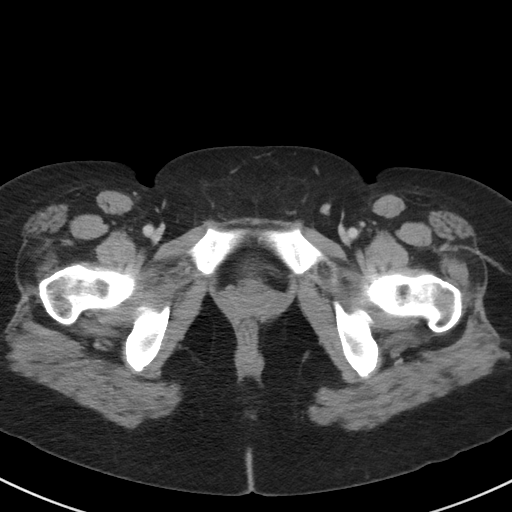
[im 17/85  soft-tissue]
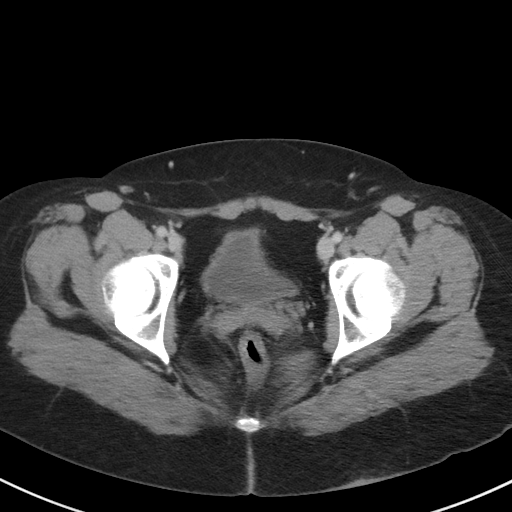
[im 24/85  soft-tissue]
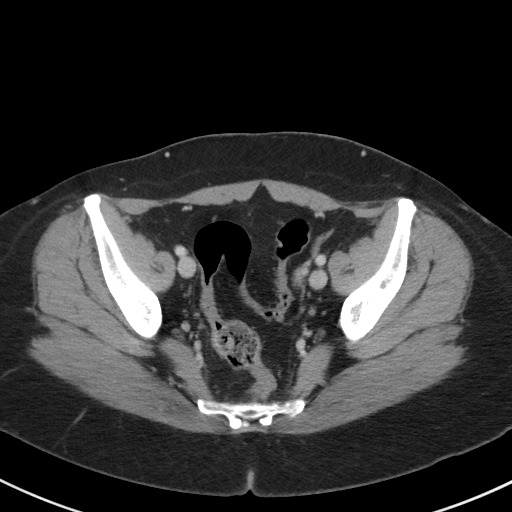
[im 31/85  soft-tissue]
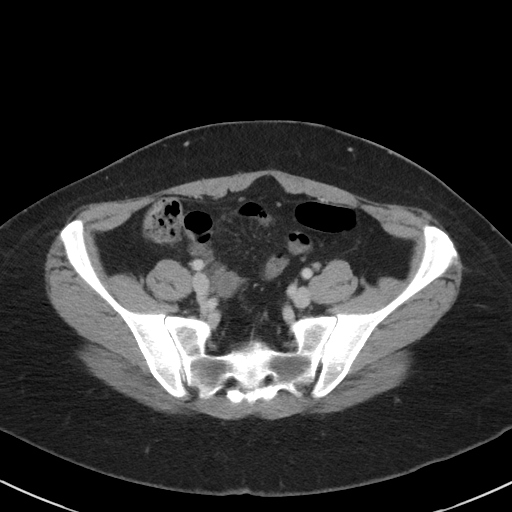
[im 37/85  soft-tissue]
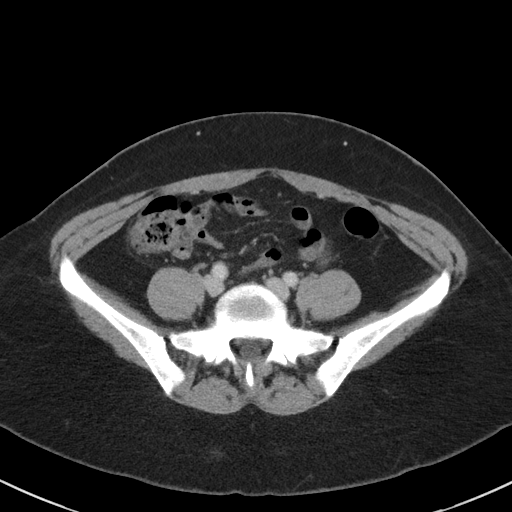
[im 44/85  soft-tissue]
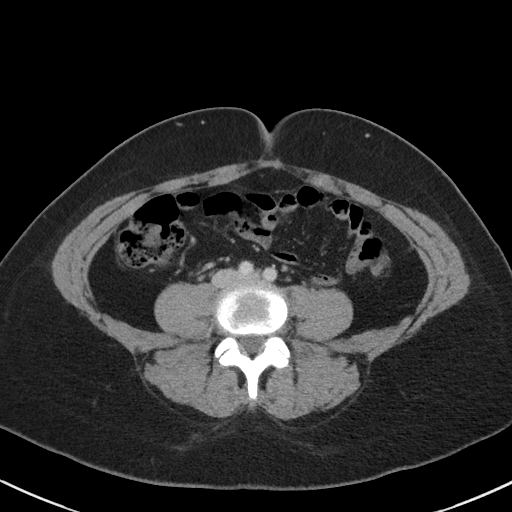
[im 48/85  soft-tissue]
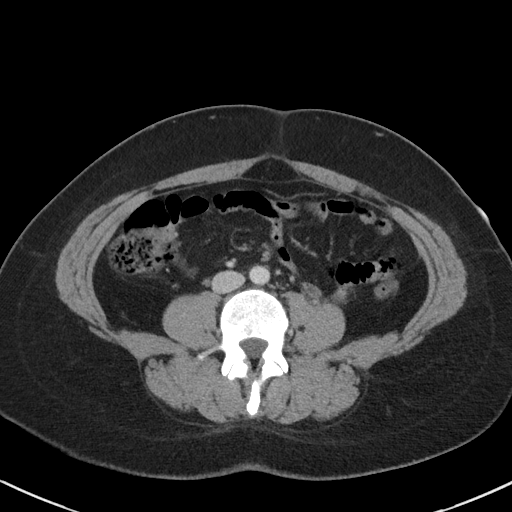
[im 54/85  soft-tissue]
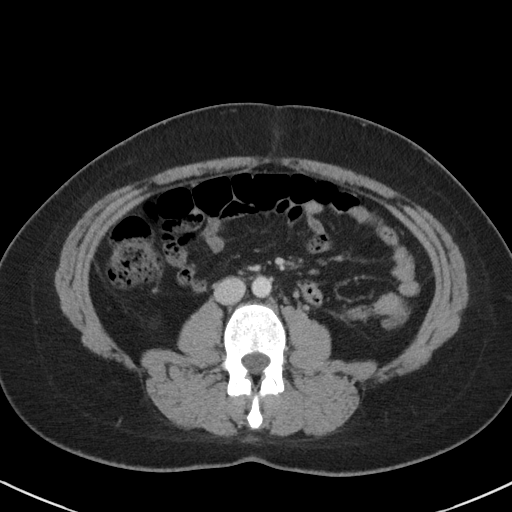
[im 54/85  bone]
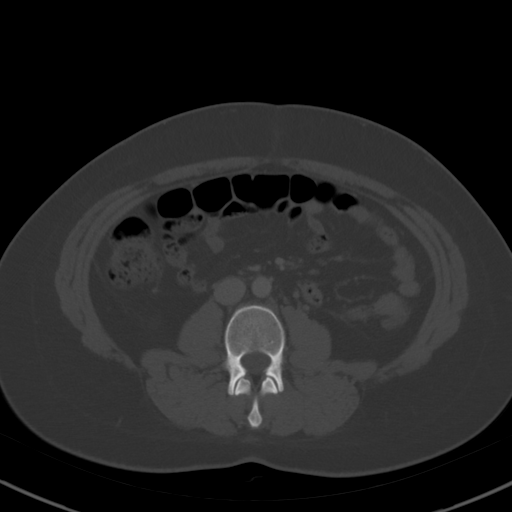
[im 61/85  soft-tissue]
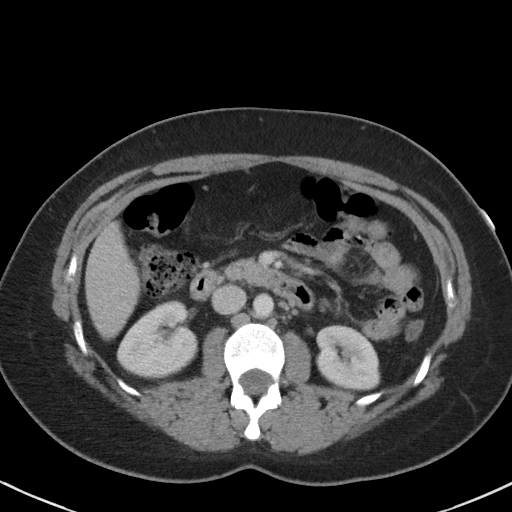
[im 68/85  soft-tissue]
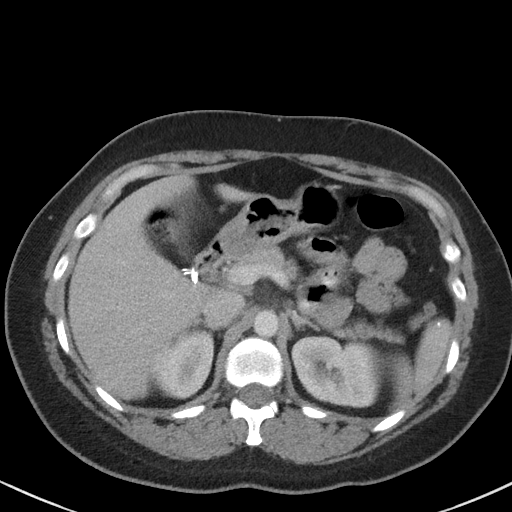
[im 74/85  soft-tissue]
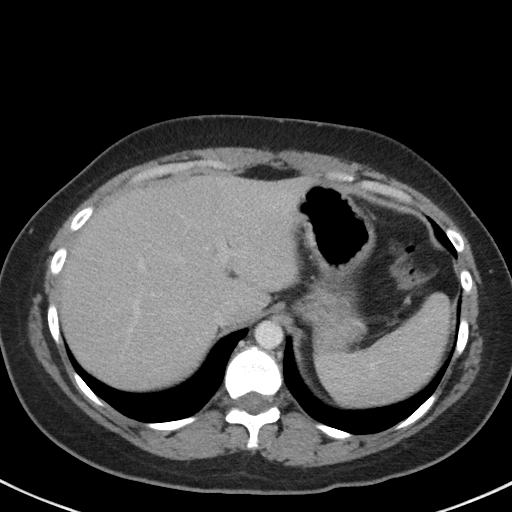
[im 81/85  soft-tissue]
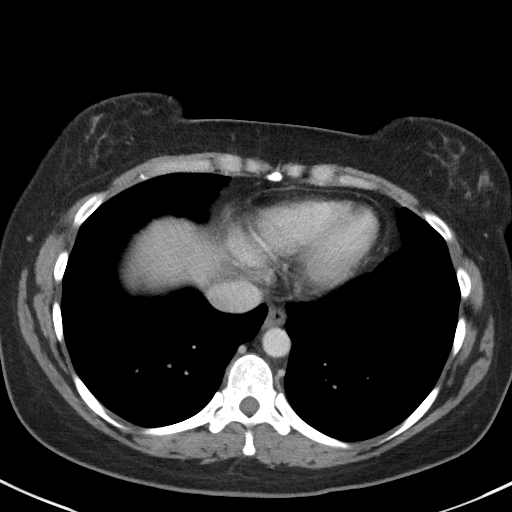

[Series 5: coronal st · coronal · 0.77mm/px · 3 of 73 slices shown]
[im 25/73  soft-tissue]
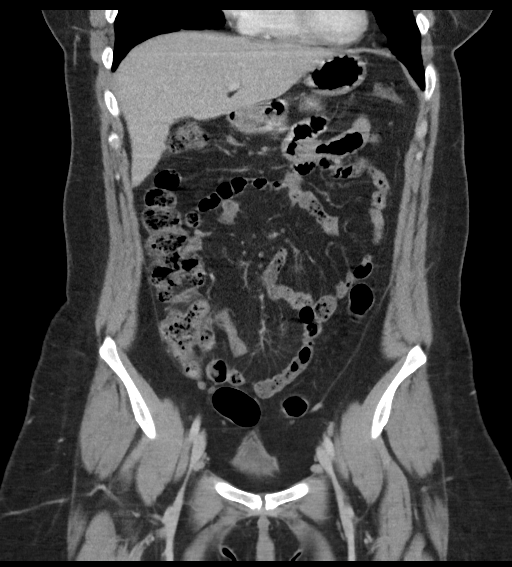
[im 33/73  soft-tissue]
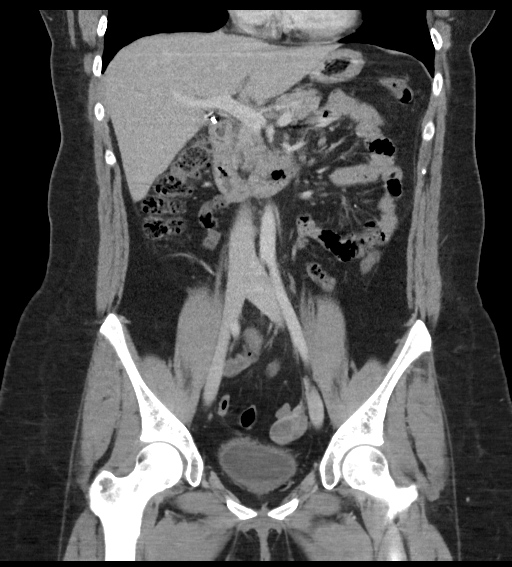
[im 41/73  soft-tissue]
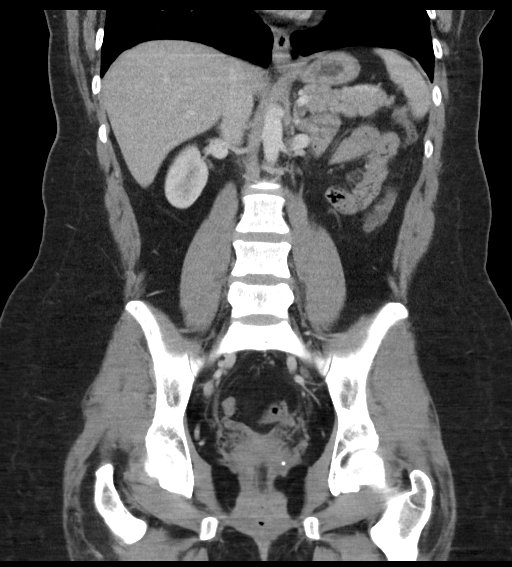

[16 of 46 positions shown; findings below may reference images not displayed]

FINDINGS: Lower chest: No consolidation or pleural fluid.

Hepatobiliary: Lobulated 18 mm subcapsular low-density in the right
hepatic lobe, series 2, image 16. This is grossly stable from prior
exams, limited assessment on priors due to lack of IV contrast.
Hemangioma is seen on 6365 ultrasound. Status post cholecystectomy.
No biliary dilatation.

Pancreas: No ductal dilatation or inflammation.

Spleen: Calcified splenic granuloma.  Normal in size.

Adrenals/Urinary Tract: Normal adrenal glands. No hydronephrosis or
perinephric edema. Decompressed ureters. No visualized renal
calculi. Urinary bladder is only minimally distended, grossly
normal.

Stomach/Bowel: Normal appendix, best appreciated on coronal reformat
series 5 images 20-26. No appendicitis. No terminal ileal
inflammation. No bowel obstruction. No small or large bowel wall
thickening or inflammatory change. The stomach is unremarkable.

Vascular/Lymphatic: Normal caliber abdominal aorta. Patent portal
vein. Incidental phleboliths in the right ovarian vein. No enlarged
lymph nodes in the abdomen or pelvis.

Reproductive: Hysterectomy. Normal size left ovary, similar in
appearance to prior. Right ovary is not seen

Other: No free air or ascites. Postsurgical change of the upper
anterior abdominal wall.

Musculoskeletal: There are no acute or suspicious osseous
abnormalities.
IMPRESSION: 1. Normal appendix. No acute abnormality in the abdomen/pelvis.
2. Incidental hepatic hemangioma.

## 2021-12-06 DIAGNOSIS — D2261 Melanocytic nevi of right upper limb, including shoulder: Secondary | ICD-10-CM | POA: Diagnosis not present

## 2021-12-06 DIAGNOSIS — L821 Other seborrheic keratosis: Secondary | ICD-10-CM | POA: Diagnosis not present

## 2021-12-06 DIAGNOSIS — D2262 Melanocytic nevi of left upper limb, including shoulder: Secondary | ICD-10-CM | POA: Diagnosis not present

## 2021-12-06 DIAGNOSIS — D225 Melanocytic nevi of trunk: Secondary | ICD-10-CM | POA: Diagnosis not present

## 2022-01-10 DIAGNOSIS — G44209 Tension-type headache, unspecified, not intractable: Secondary | ICD-10-CM | POA: Diagnosis not present

## 2022-01-10 DIAGNOSIS — M5416 Radiculopathy, lumbar region: Secondary | ICD-10-CM | POA: Diagnosis not present

## 2022-01-10 DIAGNOSIS — G2581 Restless legs syndrome: Secondary | ICD-10-CM | POA: Diagnosis not present

## 2022-01-10 DIAGNOSIS — G43009 Migraine without aura, not intractable, without status migrainosus: Secondary | ICD-10-CM | POA: Diagnosis not present

## 2022-04-25 DIAGNOSIS — R030 Elevated blood-pressure reading, without diagnosis of hypertension: Secondary | ICD-10-CM | POA: Diagnosis not present

## 2022-04-25 DIAGNOSIS — R058 Other specified cough: Secondary | ICD-10-CM | POA: Diagnosis not present

## 2022-07-14 DIAGNOSIS — Z90710 Acquired absence of both cervix and uterus: Secondary | ICD-10-CM | POA: Diagnosis not present

## 2022-07-14 DIAGNOSIS — Z8041 Family history of malignant neoplasm of ovary: Secondary | ICD-10-CM | POA: Diagnosis not present

## 2022-07-14 DIAGNOSIS — Z01411 Encounter for gynecological examination (general) (routine) with abnormal findings: Secondary | ICD-10-CM | POA: Diagnosis not present

## 2022-07-14 DIAGNOSIS — Z9079 Acquired absence of other genital organ(s): Secondary | ICD-10-CM | POA: Diagnosis not present

## 2022-07-14 DIAGNOSIS — L732 Hidradenitis suppurativa: Secondary | ICD-10-CM | POA: Diagnosis not present

## 2022-07-15 DIAGNOSIS — Z9079 Acquired absence of other genital organ(s): Secondary | ICD-10-CM | POA: Diagnosis not present

## 2022-07-15 DIAGNOSIS — Z0000 Encounter for general adult medical examination without abnormal findings: Secondary | ICD-10-CM | POA: Diagnosis not present

## 2022-07-15 DIAGNOSIS — Z90721 Acquired absence of ovaries, unilateral: Secondary | ICD-10-CM | POA: Diagnosis not present

## 2022-08-27 DIAGNOSIS — H00015 Hordeolum externum left lower eyelid: Secondary | ICD-10-CM | POA: Diagnosis not present

## 2022-11-28 ENCOUNTER — Ambulatory Visit: Payer: BC Managed Care – PPO | Admitting: Physician Assistant

## 2022-11-28 ENCOUNTER — Encounter: Payer: Self-pay | Admitting: Physician Assistant

## 2022-11-28 VITALS — BP 128/84 | HR 95 | Temp 98.4°F | Resp 20 | Ht <= 58 in | Wt 147.0 lb

## 2022-11-28 DIAGNOSIS — L732 Hidradenitis suppurativa: Secondary | ICD-10-CM | POA: Diagnosis not present

## 2022-11-28 DIAGNOSIS — K219 Gastro-esophageal reflux disease without esophagitis: Secondary | ICD-10-CM

## 2022-11-28 DIAGNOSIS — Z13220 Encounter for screening for lipoid disorders: Secondary | ICD-10-CM

## 2022-11-28 DIAGNOSIS — G4700 Insomnia, unspecified: Secondary | ICD-10-CM | POA: Diagnosis not present

## 2022-11-28 DIAGNOSIS — Z8041 Family history of malignant neoplasm of ovary: Secondary | ICD-10-CM | POA: Diagnosis not present

## 2022-11-28 DIAGNOSIS — E559 Vitamin D deficiency, unspecified: Secondary | ICD-10-CM | POA: Diagnosis not present

## 2022-11-28 MED ORDER — TRAZODONE HCL 50 MG PO TABS
25.0000 mg | ORAL_TABLET | Freq: Every day | ORAL | 1 refills | Status: DC
Start: 2022-11-28 — End: 2023-07-10

## 2022-11-28 NOTE — Assessment & Plan Note (Signed)
Area of concern right axilla is a cyst -Continue current management and discuss with dermatologist if condition worsens.

## 2022-11-28 NOTE — Assessment & Plan Note (Signed)
Maternal grandmother had ovarian cancer, maternal aunt had uterine cancer. Patient has had genetic testing on mother's side with no concerning findings. -Continue current monitoring plan with gynecologist.

## 2022-11-28 NOTE — Assessment & Plan Note (Signed)
Difficulty maintaining sleep, wakes frequently during the night. No signs of sleep apnea on previous testing, latest was 2019. pt denies any significant weight gain since then. -Trial of Trazodone 25-50mg  as needed for sleep.

## 2022-11-28 NOTE — Assessment & Plan Note (Signed)
Previously on high dose Vitamin D2, now on daily Vitamin D3 (likely 5000 units). -Check Vitamin D levels today.

## 2022-11-28 NOTE — Progress Notes (Signed)
New patient visit   Patient: Kristi Robertson   DOB: Jul 06, 1985   36 y.o. Female  MRN: [id number] Visit Date: 11/28/2022  Today's healthcare provider: Alfredia Ferguson, PA-C   Chief Complaint  Patient presents with   Establish Care   Subjective    Kristi Robertson is a 37 y.o. female who presents today as a new patient to establish care.  HPI   Discussed the use of AI scribe software for clinical note transcription with the patient, who gave verbal consent to proceed.  History of Present Illness   The patient, with a known history of hidradenitis suppurativa, presents with a lump in the armpit. She is unsure if the lump is a lymph node or related to her hidradenitis suppurativa. The patient is currently out of her prescribed clindamycin gel for the condition and plans to request more from her dermatologist, she has an appointment next week.   In addition to the lump, the patient reports issues with sleep. She can fall asleep without difficulty but wakes up frequently during the night. She is unsure of the cause of these awakenings and expresses frustration with the disruption to her sleep. She is interested in a medication that could help her stay asleep without causing significant drowsiness or dependence.  The patient also mentions taking vitamin D supplements due to previously low levels and a medication for acid reflux. She expresses a desire to have her cholesterol and vitamin D levels checked, as her cholesterol has not been checked in over a year. She also has a history of ovarian and uterine cancer in her family, in a MGM and Mat Aunt, mother has undergone genetic testing, which showed no cause for concern. Father's side is unknown.    She has a history of early colon polyps, follows with GI. History of b/l breast cysts.        Past Medical History:  Diagnosis Date   Acid reflux    Anxiety    Chronic Epstein Barr virus (EBV) infection    Headache    migraines, cluster    Ovarian cyst    left side   PONV (postoperative nausea and vomiting)    severe   Vitamin D deficiency    Wears glasses    Past Surgical History:  Procedure Laterality Date   ABDOMINAL HYSTERECTOMY  October 20, 2011   CHOLECYSTECTOMY N/A 03/27/2014   Procedure: LAPAROSCOPIC CHOLECYSTECTOMY WITH INTRAOPERATIVE CHOLANGIOGRAM;  Surgeon: Chevis Pretty III, MD;  Location: [facility] OR;  Service: General;  Laterality: N/A;   cyst removed from ovary Right july 2010   excision to groin Left    I and D multiple times   fallopian tube and ovary removed  july 2011   INGUINAL HERNIA REPAIR N/A 07/12/2013   Procedure: EXCISION HIDRADENITIS GROIN;  Surgeon: Robyne Askew, MD;  Location: [facility];  Service: General;  Laterality: N/A;   Family Status  Relation Name Status   Mother  Alive   Father  Deceased   Sister  Alive   Mat Uncle  (Not Specified)   Mat Aunt  (Not Specified)   Mat Uncle  (Not Specified)   Neg Hx  (Not Specified)  No partnership data on file   Family History  Problem Relation Age of Onset   Hypertension Mother    Hypercholesterolemia Mother    Stroke Maternal Uncle    Diabetes Maternal Aunt    Diabetes Maternal Uncle        x3  Colon cancer Neg Hx    Colon polyps Neg Hx    Kidney disease Neg Hx    Esophageal cancer Neg Hx    Gallbladder disease Neg Hx    Social History   Socioeconomic History   Marital status: Married    Spouse name: Not on file   Number of children: 2   Years of education: Not on file   Highest education level: Not on file  Occupational History   Occupation: Medical Billing  Tobacco Use   Smoking status: Never   Smokeless tobacco: Never  Substance and Sexual Activity   Alcohol use: Yes    Comment: occasional, 1-2 beers / week   Drug use: No   Sexual activity: Yes    Birth control/protection: Surgical  Other Topics Concern   Not on file  Social History Narrative   Not on file   Social Determinants of Health   Financial Resource Strain: Not on  file  Food Insecurity: Not on file  Transportation Needs: Not on file  Physical Activity: Not on file  Stress: Not on file  Social Connections: Not on file   Outpatient Medications Prior to Visit  Medication Sig   cyproheptadine (PERIACTIN) 4 MG tablet Take 1 tablet by mouth at bedtime.   dicyclomine (BENTYL) 20 MG tablet Take 1 tablet (20 mg total) by mouth 3 (three) times daily as needed for spasms.   ibuprofen (ADVIL,MOTRIN) 200 MG tablet Take 400 mg by mouth every 6 (six) hours as needed for headache.    methocarbamol (ROBAXIN) 500 MG tablet Take by mouth.   ondansetron (ZOFRAN ODT) 4 MG disintegrating tablet Take 1 tablet (4 mg total) by mouth every 8 (eight) hours as needed.   pantoprazole (PROTONIX) 40 MG tablet Take 1 tablet (40 mg total) by mouth daily.   [DISCONTINUED] acetaminophen (TYLENOL) 500 MG tablet Take 1,000 mg by mouth every 6 (six) hours as needed for mild pain or moderate pain. (Patient not taking: Reported on 11/28/2022)   [DISCONTINUED] bifidobacterium infantis (ALIGN) capsule Take 1 capsule by mouth daily.   [DISCONTINUED] cetirizine (ZYRTEC) 10 MG tablet Take 1 tablet by mouth daily. (Patient not taking: Reported on 11/28/2022)   [DISCONTINUED] ranitidine (ZANTAC) 150 MG tablet Take 150 mg by mouth at bedtime.    No facility-administered medications prior to visit.   Allergies  Allergen Reactions   Amoxicillin Nausea And Vomiting   Ancef [Cefazolin] Hives and Itching   Bactrim [Sulfamethoxazole-Trimethoprim] Other (See Comments)    Heart racing   Codeine Nausea And Vomiting   Demerol [Meperidine] Nausea And Vomiting   Keflex [Cephalexin] Other (See Comments)    Heart racing   Sulfa Antibiotics Other (See Comments)    Heart racing   Topiramate Other (See Comments)    Didn't like the way it made her feel Didn't like the way it made her feel Didn't like the way it made her feel     Immunization History  Administered Date(s) Administered   PFIZER  Comirnaty(Gray Top)Covid-19 Tri-Sucrose Vaccine 08/30/2019, 08/29/2020   PFIZER(Purple Top)SARS-COV-2 Vaccination 09/20/2019    Health Maintenance  Topic Date Due   HIV Screening  Never done   Hepatitis C Screening  Never done   DTaP/Tdap/Td (1 - Tdap) Never done   PAP SMEAR-Modifier  Never done   COVID-19 Vaccine (4 - 2023-24 season) 12/17/2021   INFLUENZA VACCINE  11/17/2022   HPV VACCINES  Aged Out    Patient Care Team: Alfredia Ferguson, PA-C as PCP - General (  Physician Assistant)  Review of Systems  Constitutional:  Negative for fatigue and fever.  Respiratory:  Negative for cough and shortness of breath.   Cardiovascular:  Negative for chest pain and leg swelling.  Gastrointestinal:  Negative for abdominal pain.  Skin:  Positive for rash.  Neurological:  Negative for dizziness and headaches.     Objective    BP 128/84 (BP Location: Left Arm, Patient Position: Sitting, Cuff Size: Normal)   Pulse 95   Temp 98.4 F (36.9 C)   Resp 20   Ht 4\' 9"  (1.448 m)   Wt 147 lb (66.7 kg)   SpO2 99%   BMI 31.81 kg/m   Physical Exam Constitutional:      General: She is awake.     Appearance: She is well-developed.  HENT:     Head: Normocephalic.  Eyes:     Conjunctiva/sclera: Conjunctivae normal.  Cardiovascular:     Rate and Rhythm: Normal rate and regular rhythm.     Heart sounds: Normal heart sounds.  Pulmonary:     Effort: Pulmonary effort is normal.     Breath sounds: Normal breath sounds.  Chest:     Comments: Right axilla with a 3 mm erythematous well circumscribed mass in the skin. Nonfluctuant Skin:    General: Skin is warm.  Neurological:     Mental Status: She is alert and oriented to person, place, and time.  Psychiatric:        Attention and Perception: Attention normal.        Mood and Affect: Mood normal.        Speech: Speech normal.        Behavior: Behavior is cooperative.    Depression Screen    11/28/2022    2:11 PM  PHQ 2/9 Scores  PHQ -  2 Score 0   No results found for any visits on 11/28/22.  Assessment & Plan      Problem List Items Addressed This Visit       Digestive   Gastroesophageal reflux disease without esophagitis    Managed with protonix daily        Musculoskeletal and Integument   Hidradenitis    Area of concern right axilla is a cyst -Continue current management and discuss with dermatologist if condition worsens.        Other   Avitaminosis D    Previously on high dose Vitamin D2, now on daily Vitamin D3 (likely 5000 units). -Check Vitamin D levels today.      Relevant Orders   Vitamin D (25 hydroxy)   Insomnia - Primary    Difficulty maintaining sleep, wakes frequently during the night. No signs of sleep apnea on previous testing, latest was 2019. pt denies any significant weight gain since then. -Trial of Trazodone 25-50mg  as needed for sleep.      Relevant Medications   traZODone (DESYREL) 50 MG tablet   Family history of ovarian cancer    Maternal grandmother had ovarian cancer, maternal aunt had uterine cancer. Patient has had genetic testing on mother's side with no concerning findings. -Continue current monitoring plan with gynecologist.      Other Visit Diagnoses     Lipid screening       Relevant Orders   Lipid panel       Return in about 6 months (around 05/31/2023) for CPE.     I, Alfredia Ferguson, PA-C have reviewed all documentation for this visit. The documentation on  11/28/22  for the exam, diagnosis, procedures, and orders are all accurate and complete.    Alfredia Ferguson, PA-C  [facility] Primary Care at [facility] [phone] (phone) [phone] (fax)  [facility]

## 2022-11-28 NOTE — Assessment & Plan Note (Signed)
Managed with protonix daily

## 2022-11-29 ENCOUNTER — Other Ambulatory Visit (INDEPENDENT_AMBULATORY_CARE_PROVIDER_SITE_OTHER): Payer: BC Managed Care – PPO

## 2022-11-29 DIAGNOSIS — E559 Vitamin D deficiency, unspecified: Secondary | ICD-10-CM | POA: Diagnosis not present

## 2022-11-29 DIAGNOSIS — Z13220 Encounter for screening for lipoid disorders: Secondary | ICD-10-CM

## 2022-11-29 LAB — VITAMIN D 25 HYDROXY (VIT D DEFICIENCY, FRACTURES): VITD: 26.09 ng/mL — ABNORMAL LOW (ref 30.00–100.00)

## 2022-11-29 LAB — LIPID PANEL
Cholesterol: 202 mg/dL — ABNORMAL HIGH (ref 0–200)
HDL: 52.6 mg/dL (ref >39.00)
LDL Cholesterol: 126 mg/dL — ABNORMAL HIGH (ref 0–99)
NonHDL: 149.09
Total CHOL/HDL Ratio: 4
Triglycerides: 117 mg/dL (ref 0.0–149.0)
VLDL: 23.4 mg/dL (ref 0.0–40.0)

## 2023-01-05 NOTE — Progress Notes (Signed)
Established patient visit   Patient: Kristi Robertson   DOB: June 07, 1985   36 y.o. Female  MRN: [id number] Visit Date: 01/06/2023  Today's healthcare provider: Alfredia Ferguson, PA-C   Cc. Lower abdominal pain x 1 week  Subjective    HPI  Discussed the use of AI scribe software for clinical note transcription with the patient, who gave verbal consent to proceed.  History of Present Illness   The patient, with a history of multiple abdominal surgeries, s/p hysterectomy, b/l salpingectomy, and R oophorectomy 2/2 ovarian torsion and cysts, presents with a week-long history of abdominal pain. The pain initially felt like the hip had popped out of joint, but later evolved into cramp-like sensations in the hip, back, and side. The patient describes the pain as intermittent, sometimes feeling like a rubber band around the stomach pulling tight on one side.   The patient denies any urinary symptoms, such as pain during urination or blood in the urine. The patient also reports a history of irregular bowel movements, often alternating between soft and liquid stools, and never feeling fully emptied after a bowel movement. The patient also describes a sensation of trapped gas and the need to pass it.       Medications: Outpatient Medications Prior to Visit  Medication Sig   cyproheptadine (PERIACTIN) 4 MG tablet Take 1 tablet by mouth at bedtime.   dicyclomine (BENTYL) 20 MG tablet Take 1 tablet (20 mg total) by mouth 3 (three) times daily as needed for spasms.   ibuprofen (ADVIL,MOTRIN) 200 MG tablet Take 400 mg by mouth every 6 (six) hours as needed for headache.    methocarbamol (ROBAXIN) 500 MG tablet Take by mouth.   pantoprazole (PROTONIX) 40 MG tablet Take 1 tablet (40 mg total) by mouth daily.   traZODone (DESYREL) 50 MG tablet Take 0.5-1 tablets (25-50 mg total) by mouth at bedtime.   [DISCONTINUED] ondansetron (ZOFRAN ODT) 4 MG disintegrating tablet Take 1 tablet (4 mg total) by  mouth every 8 (eight) hours as needed.   No facility-administered medications prior to visit.    Review of Systems  Constitutional:  Negative for fatigue and fever.  Respiratory:  Negative for cough and shortness of breath.   Cardiovascular:  Negative for chest pain and leg swelling.  Gastrointestinal:  Positive for abdominal pain.  Neurological:  Negative for dizziness and headaches.      Objective    BP 124/82 (BP Location: Left Arm, Patient Position: Sitting, Cuff Size: Normal)   Pulse 98   Temp 98 F (36.7 C) (Oral)   Resp 20   Ht 4\' 9"  (1.448 m)   Wt 145 lb (65.8 kg)   SpO2 99%   BMI 31.38 kg/m   Physical Exam Constitutional:      General: She is awake.     Appearance: She is well-developed.  HENT:     Head: Normocephalic.  Eyes:     Conjunctiva/sclera: Conjunctivae normal.  Cardiovascular:     Rate and Rhythm: Normal rate and regular rhythm.     Heart sounds: Normal heart sounds.  Pulmonary:     Effort: Pulmonary effort is normal.     Breath sounds: Normal breath sounds.  Abdominal:     Tenderness: There is abdominal tenderness in the right upper quadrant, right lower quadrant and left lower quadrant. There is no guarding. Negative signs include McBurney's sign.  Skin:    General: Skin is warm.  Neurological:     Mental Status:  She is alert and oriented to person, place, and time.  Psychiatric:        Attention and Perception: Attention normal.        Mood and Affect: Mood normal.        Speech: Speech normal.        Behavior: Behavior is cooperative.      No results found for any visits on 01/06/23.  Assessment & Plan     1. Right sided abdominal pain -minimally tender, s/p rt ooph,rt salping, and hysterectomy. Vitals stable. Low sus for appendicitis.   -UA negative. -Advise over-the-counter Gas-X for possible gas-related discomfort. -Advise Miralax or a plain stool softener for possible constipation. -Consider imaging if pain persists for  another week.   - POCT urinalysis dipstick  2. Chronic nausea Refillex zofran. 2/2 gerd. Not worse than usual.  - ondansetron (ZOFRAN ODT) 4 MG disintegrating tablet; Take 1 tablet (4 mg total) by mouth every 8 (eight) hours as needed.  Dispense: 60 tablet; Refill: 1   Return if symptoms worsen or fail to improve.      I, Alfredia Ferguson, PA-C have reviewed all documentation for this visit. The documentation on  01/06/23   for the exam, diagnosis, procedures, and orders are all accurate and complete.    Alfredia Ferguson, PA-C  [facility] Primary [facility] at [facility] [phone] (phone) [phone] (fax)  [facility]

## 2023-01-06 ENCOUNTER — Encounter: Payer: Self-pay | Admitting: Physician Assistant

## 2023-01-06 ENCOUNTER — Ambulatory Visit: Payer: BC Managed Care – PPO | Admitting: Physician Assistant

## 2023-01-06 VITALS — BP 124/82 | HR 98 | Temp 98.0°F | Resp 20 | Ht <= 58 in | Wt 145.0 lb

## 2023-01-06 DIAGNOSIS — R109 Unspecified abdominal pain: Secondary | ICD-10-CM | POA: Diagnosis not present

## 2023-01-06 DIAGNOSIS — R110 Nausea: Secondary | ICD-10-CM

## 2023-01-06 LAB — POCT URINALYSIS DIP (MANUAL ENTRY)
Bilirubin, UA: NEGATIVE
Blood, UA: NEGATIVE
Glucose, UA: NEGATIVE mg/dL
Ketones, POC UA: NEGATIVE mg/dL
Leukocytes, UA: NEGATIVE
Nitrite, UA: NEGATIVE
Protein Ur, POC: NEGATIVE mg/dL
Spec Grav, UA: 1.015 (ref 1.010–1.025)
Urobilinogen, UA: 0.2 E.U./dL
pH, UA: 6 (ref 5.0–8.0)

## 2023-01-06 MED ORDER — ONDANSETRON 4 MG PO TBDP: 4.0000 mg | ORAL_TABLET | Freq: Three times a day (TID) | ORAL | 1 refills | Status: AC | PRN

## 2023-01-06 NOTE — Assessment & Plan Note (Signed)
From GERD, pt takes zofran prn

## 2023-01-20 DIAGNOSIS — Z860100 Personal history of colon polyps, unspecified: Secondary | ICD-10-CM | POA: Diagnosis not present

## 2023-01-20 DIAGNOSIS — K219 Gastro-esophageal reflux disease without esophagitis: Secondary | ICD-10-CM | POA: Diagnosis not present

## 2023-02-06 DIAGNOSIS — G44209 Tension-type headache, unspecified, not intractable: Secondary | ICD-10-CM | POA: Diagnosis not present

## 2023-02-06 DIAGNOSIS — G43009 Migraine without aura, not intractable, without status migrainosus: Secondary | ICD-10-CM | POA: Diagnosis not present

## 2023-02-10 DIAGNOSIS — D225 Melanocytic nevi of trunk: Secondary | ICD-10-CM | POA: Diagnosis not present

## 2023-02-10 DIAGNOSIS — L820 Inflamed seborrheic keratosis: Secondary | ICD-10-CM | POA: Diagnosis not present

## 2023-02-10 DIAGNOSIS — L814 Other melanin hyperpigmentation: Secondary | ICD-10-CM | POA: Diagnosis not present

## 2023-02-10 DIAGNOSIS — D485 Neoplasm of uncertain behavior of skin: Secondary | ICD-10-CM | POA: Diagnosis not present

## 2023-02-10 DIAGNOSIS — L821 Other seborrheic keratosis: Secondary | ICD-10-CM | POA: Diagnosis not present

## 2023-02-10 DIAGNOSIS — L738 Other specified follicular disorders: Secondary | ICD-10-CM | POA: Diagnosis not present

## 2023-03-09 DIAGNOSIS — Z8742 Personal history of other diseases of the female genital tract: Secondary | ICD-10-CM | POA: Diagnosis not present

## 2023-03-09 DIAGNOSIS — Z1273 Encounter for screening for malignant neoplasm of ovary: Secondary | ICD-10-CM | POA: Diagnosis not present

## 2023-03-09 DIAGNOSIS — R102 Pelvic and perineal pain: Secondary | ICD-10-CM | POA: Diagnosis not present

## 2023-03-09 DIAGNOSIS — Z9079 Acquired absence of other genital organ(s): Secondary | ICD-10-CM | POA: Diagnosis not present

## 2023-03-09 DIAGNOSIS — N809 Endometriosis, unspecified: Secondary | ICD-10-CM | POA: Diagnosis not present

## 2023-03-28 ENCOUNTER — Ambulatory Visit: Payer: BC Managed Care – PPO | Admitting: Physician Assistant

## 2023-03-30 DIAGNOSIS — N907 Vulvar cyst: Secondary | ICD-10-CM | POA: Diagnosis not present

## 2023-03-30 DIAGNOSIS — R102 Pelvic and perineal pain: Secondary | ICD-10-CM | POA: Diagnosis not present

## 2023-03-30 DIAGNOSIS — M7918 Myalgia, other site: Secondary | ICD-10-CM | POA: Diagnosis not present

## 2023-03-30 DIAGNOSIS — Z90710 Acquired absence of both cervix and uterus: Secondary | ICD-10-CM | POA: Diagnosis not present

## 2023-04-26 DIAGNOSIS — Z8742 Personal history of other diseases of the female genital tract: Secondary | ICD-10-CM | POA: Diagnosis not present

## 2023-04-26 DIAGNOSIS — Z1273 Encounter for screening for malignant neoplasm of ovary: Secondary | ICD-10-CM | POA: Diagnosis not present

## 2023-04-26 DIAGNOSIS — Z90710 Acquired absence of both cervix and uterus: Secondary | ICD-10-CM | POA: Diagnosis not present

## 2023-05-25 ENCOUNTER — Encounter: Payer: Self-pay | Admitting: Physician Assistant

## 2023-05-31 ENCOUNTER — Encounter: Payer: Self-pay | Admitting: Physician Assistant

## 2023-07-08 ENCOUNTER — Other Ambulatory Visit: Payer: Self-pay | Admitting: Physician Assistant

## 2023-07-08 DIAGNOSIS — G4700 Insomnia, unspecified: Secondary | ICD-10-CM

## 2023-07-12 DIAGNOSIS — Z13220 Encounter for screening for lipoid disorders: Secondary | ICD-10-CM | POA: Diagnosis not present

## 2023-07-12 DIAGNOSIS — Z0000 Encounter for general adult medical examination without abnormal findings: Secondary | ICD-10-CM | POA: Diagnosis not present

## 2023-07-18 DIAGNOSIS — R399 Unspecified symptoms and signs involving the genitourinary system: Secondary | ICD-10-CM | POA: Diagnosis not present

## 2023-07-18 DIAGNOSIS — R7989 Other specified abnormal findings of blood chemistry: Secondary | ICD-10-CM | POA: Diagnosis not present

## 2023-07-18 DIAGNOSIS — Z13220 Encounter for screening for lipoid disorders: Secondary | ICD-10-CM | POA: Diagnosis not present

## 2023-07-28 DIAGNOSIS — L02213 Cutaneous abscess of chest wall: Secondary | ICD-10-CM | POA: Diagnosis not present

## 2023-07-28 DIAGNOSIS — L720 Epidermal cyst: Secondary | ICD-10-CM | POA: Diagnosis not present

## 2023-08-31 ENCOUNTER — Encounter: Payer: Self-pay | Admitting: Physician Assistant

## 2023-09-07 ENCOUNTER — Ambulatory Visit: Admitting: Physician Assistant

## 2023-09-07 ENCOUNTER — Encounter: Payer: Self-pay | Admitting: Physician Assistant

## 2023-09-07 VITALS — BP 124/82 | HR 84 | Ht <= 58 in | Wt 148.4 lb

## 2023-09-07 DIAGNOSIS — R946 Abnormal results of thyroid function studies: Secondary | ICD-10-CM | POA: Diagnosis not present

## 2023-09-07 DIAGNOSIS — R7989 Other specified abnormal findings of blood chemistry: Secondary | ICD-10-CM | POA: Diagnosis not present

## 2023-09-07 DIAGNOSIS — H66001 Acute suppurative otitis media without spontaneous rupture of ear drum, right ear: Secondary | ICD-10-CM

## 2023-09-07 LAB — TSH: TSH: 3.1 u[IU]/mL (ref 0.35–5.50)

## 2023-09-07 LAB — T4, FREE: Free T4: 0.79 ng/dL (ref 0.60–1.60)

## 2023-09-07 MED ORDER — DOXYCYCLINE HYCLATE 100 MG PO TABS: 100.0000 mg | ORAL_TABLET | Freq: Two times a day (BID) | ORAL | 0 refills | Status: AC

## 2023-09-07 NOTE — Progress Notes (Signed)
 Established patient visit   Patient: Kristi Robertson   DOB: Dec 31, 1985   38 y.o. Female  MRN: [id number] Visit Date: 09/07/2023  Today's healthcare provider: Trenton Frock, PA-C   Cc. Right ear pain, abnormal labs.  Subjective     Discussed the use of AI scribe software for clinical note transcription with the patient, who gave verbal consent to proceed.  History of Present Illness   Kristi Robertson is a 38 year old female who presents with right ear pain radiating to the jaw.  She experiences right ear pain radiating to the jaw with a sensation of fullness in the ear. Initially localized to the ear, the pain extended to the jaw, causing intense discomfort. Tenderness is present under the jaw. Swallowing was painful but has resolved.  Hearing in the affected ear is not muffled, but there is intermittent popping and temporary hearing loss. Denies dental pain, pain w/ chewing, change in taste. Saw her dentist a few months ago, has upcoming regular appointment.  She takes Tylenol  for pain relief.   She recently had labs w/ her gyn, tsh was elevated. Pt admits to fatigue, dry skin, family history of thyroid issues.      Medications: Outpatient Medications Prior to Visit  Medication Sig   cyproheptadine (PERIACTIN) 4 MG tablet Take 1 tablet by mouth at bedtime.   dicyclomine  (BENTYL ) 20 MG tablet Take 1 tablet (20 mg total) by mouth 3 (three) times daily as needed for spasms.   ibuprofen  (ADVIL ,MOTRIN ) 200 MG tablet Take 400 mg by mouth every 6 (six) hours as needed for headache.    methocarbamol (ROBAXIN) 500 MG tablet Take by mouth.   ondansetron  (ZOFRAN  ODT) 4 MG disintegrating tablet Take 1 tablet (4 mg total) by mouth every 8 (eight) hours as needed.   pantoprazole  (PROTONIX ) 40 MG tablet Take 1 tablet (40 mg total) by mouth daily.   traZODone  (DESYREL ) 50 MG tablet TAKE 1/2 TO 1 TABLET (25 TO 50 MG TOTAL) BY MOUTH AT BEDTIME   No facility-administered medications prior  to visit.    Review of Systems  Constitutional:  Negative for fatigue and fever.  HENT:  Positive for ear pain.   Respiratory:  Negative for cough and shortness of breath.   Cardiovascular:  Negative for chest pain and leg swelling.  Gastrointestinal:  Negative for abdominal pain.  Neurological:  Negative for dizziness and headaches.       Objective    BP 124/82   Pulse 84   Ht 4\' 9"  (1.448 m)   Wt 148 lb 6.4 oz (67.3 kg)   BMI 32.11 kg/m    Physical Exam Vitals reviewed.  Constitutional:      Appearance: She is not ill-appearing.  HENT:     Head: Normocephalic.     Ears:     Comments: Right TM bulging ,erythematous. Left TM bulging w/ clear fluid no erythema    Mouth/Throat:     Comments: No visible breaks in dentition, abscess. Eyes:     Conjunctiva/sclera: Conjunctivae normal.  Cardiovascular:     Rate and Rhythm: Normal rate.  Pulmonary:     Effort: Pulmonary effort is normal. No respiratory distress.  Lymphadenopathy:     Cervical: No cervical adenopathy.  Neurological:     Mental Status: She is alert and oriented to person, place, and time.  Psychiatric:        Mood and Affect: Mood normal.        Behavior: Behavior normal.  No results found for any visits on 09/07/23.  Assessment & Plan    Abnormal thyroid blood test -     TSH -     T4, free -     Thyroid Peroxidase Antibodies (TPO) (REFL)  Non-recurrent acute suppurative otitis media of right ear without spontaneous rupture of tympanic membrane -     Doxycycline  Hyclate; Take 1 tablet (100 mg total) by mouth 2 (two) times daily for 7 days.  Dispense: 14 tablet; Refill: 0    Ear infection - Prescribe doxycycline  100 mg twice daily for 7 days due to medication allergies.   Thyroid function abnormality - Order repeat thyroid function tests and thyroid antibody tests.     Return if symptoms worsen or fail to improve.       Trenton Frock, PA-C  [facility] Primary Care at  [facility] [phone] (phone) [phone] (fax)  [facility]

## 2023-09-08 LAB — THYROID PEROXIDASE ANTIBODIES (TPO) (REFL): Thyroperoxidase Ab SerPl-aCnc: 1 [IU]/mL (ref <9)

## 2023-09-12 ENCOUNTER — Ambulatory Visit: Payer: Self-pay | Admitting: Physician Assistant

## 2023-09-14 ENCOUNTER — Encounter: Payer: Self-pay | Admitting: Physician Assistant

## 2023-09-15 ENCOUNTER — Other Ambulatory Visit: Payer: Self-pay | Admitting: Physician Assistant

## 2023-09-15 DIAGNOSIS — H66001 Acute suppurative otitis media without spontaneous rupture of ear drum, right ear: Secondary | ICD-10-CM

## 2023-09-15 MED ORDER — AZELASTINE HCL 0.1 % NA SOLN: 1.0000 | Freq: Two times a day (BID) | NASAL | 0 refills | Status: DC

## 2023-11-01 ENCOUNTER — Ambulatory Visit (INDEPENDENT_AMBULATORY_CARE_PROVIDER_SITE_OTHER): Admitting: Physician Assistant

## 2023-11-01 ENCOUNTER — Encounter: Payer: Self-pay | Admitting: Physician Assistant

## 2023-11-01 VITALS — BP 134/83 | HR 83 | Ht <= 58 in | Wt 150.6 lb

## 2023-11-01 DIAGNOSIS — F419 Anxiety disorder, unspecified: Secondary | ICD-10-CM

## 2023-11-01 DIAGNOSIS — H9391 Unspecified disorder of right ear: Secondary | ICD-10-CM | POA: Diagnosis not present

## 2023-11-01 DIAGNOSIS — R002 Palpitations: Secondary | ICD-10-CM | POA: Diagnosis not present

## 2023-11-01 MED ORDER — MUPIROCIN 2 % EX OINT: 1.0000 | TOPICAL_OINTMENT | Freq: Two times a day (BID) | CUTANEOUS | 0 refills | Status: DC

## 2023-11-01 MED ORDER — ESCITALOPRAM OXALATE 5 MG PO TABS: 5.0000 mg | ORAL_TABLET | Freq: Every day | ORAL | 1 refills | Status: DC

## 2023-11-01 NOTE — Progress Notes (Signed)
 Established patient visit   Patient: Kristi Robertson   DOB: 1986-03-11   38 y.o. Female  MRN: [id number] Visit Date: 11/01/2023  Today's healthcare provider: Manuelita Flatness, PA-C   Cc. Right ear pain   Subjective     Discussed the use of AI scribe software for clinical note transcription with the patient, who gave verbal consent to proceed.  History of Present Illness   Kristi Robertson is a 38 year old female who presents with a bump on her right ear and concerns about PVCs and anxiety.  She has noticed a bump on her right ear for about a week and a half, which bleeds slightly if accidentally touched. There was a cut in the same area before the bump appeared. She has a history of an ear infection in the same ear, which still feels full. She has used Flonase nasal spray in the past and has azelastine  at home, which she forgot to use for two weeks.   She experienced PVCs in 2022 and noticed them again during a beach trip from June 14 to October 07, 2023, when she also tested positive for COVID-19. During that week, she felt extremely tired and had a severe headache for three days. She felt the PVCs frequently, counting up to fifty in a day. Since recovering from COVID-19, she has only felt the PVCs two or three times. She is requesting a referral back to cardiology.  She experiences increasing anxiety, affecting her daily life. She feels trapped in situations like plane rides and experiences heightened anxiety before doctor's appointments, manifesting as nervousness and nausea. She had a potential allergic reaction to Xanax, which caused severe itching.       Medications: Outpatient Medications Prior to Visit  Medication Sig   azelastine  (ASTELIN ) 0.1 % nasal spray Place 1 spray into both nostrils 2 (two) times daily. Use in each nostril as directed   cyproheptadine (PERIACTIN) 4 MG tablet Take 1 tablet by mouth at bedtime.   dicyclomine  (BENTYL ) 20 MG tablet Take 1 tablet (20 mg  total) by mouth 3 (three) times daily as needed for spasms.   ibuprofen  (ADVIL ,MOTRIN ) 200 MG tablet Take 400 mg by mouth every 6 (six) hours as needed for headache.    methocarbamol (ROBAXIN) 500 MG tablet Take by mouth.   ondansetron  (ZOFRAN  ODT) 4 MG disintegrating tablet Take 1 tablet (4 mg total) by mouth every 8 (eight) hours as needed.   pantoprazole  (PROTONIX ) 40 MG tablet Take 1 tablet (40 mg total) by mouth daily.   traZODone  (DESYREL ) 50 MG tablet TAKE 1/2 TO 1 TABLET (25 TO 50 MG TOTAL) BY MOUTH AT BEDTIME   No facility-administered medications prior to visit.    Review of Systems  Constitutional:  Negative for fatigue and fever.  Respiratory:  Negative for cough and shortness of breath.   Cardiovascular:  Positive for palpitations. Negative for chest pain and leg swelling.  Gastrointestinal:  Negative for abdominal pain.  Neurological:  Negative for dizziness and headaches.  Psychiatric/Behavioral:  The patient is nervous/anxious.        Objective    BP 134/83   Pulse 83   Ht 4' 9 (1.448 m)   Wt 150 lb 9.6 oz (68.3 kg)   BMI 32.59 kg/m    Physical Exam Constitutional:      General: She is awake.     Appearance: She is well-developed.  HENT:     Head: Normocephalic.  Eyes:  Conjunctiva/sclera: Conjunctivae normal.  Cardiovascular:     Rate and Rhythm: Normal rate and regular rhythm.     Heart sounds: Normal heart sounds.  Pulmonary:     Effort: Pulmonary effort is normal.  Skin:    General: Skin is warm.     Comments: Right lower ear there is a 2-3 mm raised, round, erythematous, oozing lesion  Neurological:     Mental Status: She is alert and oriented to person, place, and time.  Psychiatric:        Attention and Perception: Attention normal.        Mood and Affect: Mood normal.        Speech: Speech normal.        Behavior: Behavior is cooperative.      No results found for any visits on 11/01/23.  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 Pt has derm will call.  -     Mupirocin ; Apply 1 Application topically 2 (two) times daily.  Dispense: 22 g; Refill: 0  Heart palpitations Pt preferring to see cardiology. Palpitations have resolved since covid.  -     Ambulatory referral to Cardiology  Anxiety Reviewed different options for anxiety management Suggesting lexapro  5 mg at bedtime.  F/b 4-6 weeks -     Escitalopram  Oxalate; Take 1 tablet (5 mg total) by mouth daily.  Dispense: 90 tablet; Refill: 1   Return in about 4 weeks (around 11/29/2023) for anxiety.       Manuelita Flatness, PA-C  [facility] Primary Care at [facility] [phone] (phone) [phone] (fax)  [facility]

## 2023-11-07 ENCOUNTER — Ambulatory Visit: Attending: Cardiology

## 2023-11-07 ENCOUNTER — Ambulatory Visit: Attending: Cardiology | Admitting: Cardiology

## 2023-11-07 ENCOUNTER — Encounter: Payer: Self-pay | Admitting: Cardiology

## 2023-11-07 VITALS — BP 110/82 | HR 92 | Ht <= 58 in | Wt 149.0 lb

## 2023-11-07 DIAGNOSIS — R0609 Other forms of dyspnea: Secondary | ICD-10-CM | POA: Insufficient documentation

## 2023-11-07 DIAGNOSIS — Z7689 Persons encountering health services in other specified circumstances: Secondary | ICD-10-CM

## 2023-11-07 DIAGNOSIS — R002 Palpitations: Secondary | ICD-10-CM

## 2023-11-07 DIAGNOSIS — R011 Cardiac murmur, unspecified: Secondary | ICD-10-CM | POA: Diagnosis not present

## 2023-11-07 NOTE — Progress Notes (Signed)
 Cardiology Office Note:    Date:  11/07/2023   ID:  Kristi Robertson, DOB 1985/09/22, MRN [id number]  PCP:  Cyndi Shaver, PA-C  Cardiologist:  Jennifer JONELLE Crape, MD   Referring MD: Cyndi Shaver, PA-C    ASSESSMENT:    1. Encounter to establish care   2. Palpitations   3. Dyspnea on exertion   4. Cardiac murmur    PLAN:    In order of problems listed above:  Primary prevention stressed with the patient.  Importance of compliance with diet medication stressed and patient verbalized standing. Cardiac murmur: Echocardiogram will be done to assess murmur heard on auscultation. Palpitations: History of PVCs: Will do a 2-week monitor.  She is agreeable. Dyspnea on exertion: Will do exercise stress echo to assess this. Mildly elevated lipids: Diet was emphasized.  Weight reduction stressed risks of obesity explained and she promises to do better.  If the stress test is normal she was advised to start an exercise program. Patient will be seen in follow-up appointment in 6 months or earlier if the patient has any concerns.    Medication Adjustments/Labs and Tests Ordered: Current medicines are reviewed at length with the patient today.  Concerns regarding medicines are outlined above.  Orders Placed This Encounter  Procedures   EKG 12-Lead   No orders of the defined types were placed in this encounter.    History of Present Illness:    Kristi Robertson is a 38 y.o. female who is being seen today for the evaluation of palpitations at the request of Drubel, Shaver, PA-C.  Patient is a pleasant 38 year old female.  She has no significant past medical history.  Her lipids are mildly elevated.  She leads a sedentary lifestyle.  She gives history of some dyspnea on exertion.  No orthopnea or PND.  She has an abnormal sensation of feeling her PVCs.  She has had issues in the past and was evaluated for this.  She feels that after COVID earlier this year this has recurred.  No dizziness  syncope or any such issues.  At the time of my evaluation, the patient is alert awake oriented and in no distress.  Past Medical History:  Diagnosis Date   Acid reflux    Anxiety    Chronic Epstein Barr virus (EBV) infection    Headache    migraines, cluster   Ovarian cyst    left side   PONV (postoperative nausea and vomiting)    severe   Vitamin D  deficiency    Wears glasses     Past Surgical History:  Procedure Laterality Date   ABDOMINAL HYSTERECTOMY  October 20, 2011   CHOLECYSTECTOMY N/A 03/27/2014   Procedure: LAPAROSCOPIC CHOLECYSTECTOMY WITH INTRAOPERATIVE CHOLANGIOGRAM;  Surgeon: Deward Null III, MD;  Location: [facility] OR;  Service: General;  Laterality: N/A;   cyst removed from ovary Right july 2010   excision to groin Left    I and D multiple times   fallopian tube and ovary removed  july 2011   INGUINAL HERNIA REPAIR N/A 07/12/2013   Procedure: EXCISION HIDRADENITIS GROIN;  Surgeon: Deward GORMAN Null III, MD;  Location: [facility];  Service: General;  Laterality: N/A;    Current Medications: Current Meds  Medication Sig   azelastine  (ASTELIN ) 0.1 % nasal spray Place 1 spray into both nostrils 2 (two) times daily. Use in each nostril as directed   cholecalciferol (VITAMIN D3) 25 MCG (1000 UNIT) tablet Take 1,000 Units by mouth daily.   cyproheptadine (  PERIACTIN) 4 MG tablet Take 1 tablet by mouth at bedtime.   escitalopram  (LEXAPRO ) 5 MG tablet Take 1 tablet (5 mg total) by mouth daily.   ibuprofen  (ADVIL ,MOTRIN ) 200 MG tablet Take 400 mg by mouth every 6 (six) hours as needed for headache.    mupirocin  ointment (BACTROBAN ) 2 % Apply 1 Application topically 2 (two) times daily.   ondansetron  (ZOFRAN  ODT) 4 MG disintegrating tablet Take 1 tablet (4 mg total) by mouth every 8 (eight) hours as needed.   pantoprazole  (PROTONIX ) 40 MG tablet Take 1 tablet (40 mg total) by mouth daily.   traZODone  (DESYREL ) 50 MG tablet TAKE 1/2 TO 1 TABLET (25 TO 50 MG TOTAL) BY MOUTH AT BEDTIME      Allergies:   Amoxicillin, Ancef [cefazolin], Bactrim [sulfamethoxazole-trimethoprim], Codeine, Demerol [meperidine], Keflex [cephalexin], Sulfa antibiotics, and Topiramate   Social History   Socioeconomic History   Marital status: Married    Spouse name: Not on file   Number of children: 2   Years of education: Not on file   Highest education level: Not on file  Occupational History   Occupation: Medical Billing  Tobacco Use   Smoking status: Never   Smokeless tobacco: Never  Substance and Sexual Activity   Alcohol use: Yes    Comment: occasional, 1-2 beers / week   Drug use: No   Sexual activity: Yes    Birth control/protection: Surgical  Other Topics Concern   Not on file  Social History Narrative   Not on file   Social Drivers of Health   Financial Resource Strain: Not on file  Food Insecurity: Not on file  Transportation Needs: Not on file  Physical Activity: Not on file  Stress: Not on file  Social Connections: Not on file     Family History: The patient's family history includes Diabetes in her maternal aunt and maternal uncle; Hypercholesterolemia in her mother; Hypertension in her mother; Stroke in her maternal uncle. There is no history of Colon cancer, Colon polyps, Kidney disease, Esophageal cancer, or Gallbladder disease.  ROS:   Please see the history of present illness.    All other systems reviewed and are negative.  EKGs/Labs/Other Studies Reviewed:    The following studies were reviewed today:  EKG Interpretation Date/Time:  Tuesday November 07 2023 15:53:00 EDT Ventricular Rate:  92 PR Interval:  132 QRS Duration:  72 QT Interval:  350 QTC Calculation: 432 R Axis:   12  Text Interpretation: Normal sinus rhythm Possible Left atrial enlargement When compared with ECG of 30-May-2020 20:39, Premature ventricular complexes are no longer Present T wave inversion no longer evident in Inferior leads Nonspecific T wave abnormality, improved in Anterior  leads Confirmed by Edwyna Backers [phone]) on 11/07/2023 4:10:49 PM     Recent Labs: 09/07/2023: TSH 3.10  Recent Lipid Panel    Component Value Date/Time   CHOL 202 (H) 11/29/2022 0913   TRIG 117.0 11/29/2022 0913   HDL 52.60 11/29/2022 0913   CHOLHDL 4 11/29/2022 0913   VLDL 23.4 11/29/2022 0913   LDLCALC 126 (H) 11/29/2022 0913    Physical Exam:    VS:  BP 110/82 (BP Location: Left Arm, Patient Position: Sitting, Cuff Size: Normal)   Pulse 92   Ht 4' 9 (1.448 m)   Wt 149 lb (67.6 kg)   SpO2 99%   BMI 32.24 kg/m     Wt Readings from Last 3 Encounters:  11/07/23 149 lb (67.6 kg)  11/01/23 150 lb 9.6  oz (68.3 kg)  09/07/23 148 lb 6.4 oz (67.3 kg)     GEN: Patient is in no acute distress HEENT: Normal NECK: No JVD; No carotid bruits LYMPHATICS: No lymphadenopathy CARDIAC: S1 S2 regular, 2/6 systolic murmur at the apex. RESPIRATORY:  Clear to auscultation without rales, wheezing or rhonchi  ABDOMEN: Soft, non-tender, non-distended MUSCULOSKELETAL:  No edema; No deformity  SKIN: Warm and dry NEUROLOGIC:  Alert and oriented x 3 PSYCHIATRIC:  Normal affect    Signed, Jennifer JONELLE Crape, MD  11/07/2023 4:31 PM    [facility]

## 2023-11-07 NOTE — Patient Instructions (Addendum)
 Medication Instructions:  Your physician recommends that you continue on your current medications as directed. Please refer to the Current Medication list given to you today.  *If you need a refill on your cardiac medications before your next appointment, please call your pharmacy*  Testing/Procedures: Your physician has requested that you have an echocardiogram. Echocardiography is a painless test that uses sound waves to create images of your heart. It provides your doctor with information about the size and shape of your heart and how well your heart's chambers and valves are working. This procedure takes approximately one hour. There are no restrictions for this procedure. Please do NOT wear cologne, perfume, aftershave, or lotions (deodorant is allowed). Please arrive 15 minutes prior to your appointment time.  Please note: We ask at that you not bring children with you during ultrasound (echo/ vascular) testing. Due to room size and safety concerns, children are not allowed in the ultrasound rooms during exams. Our front office staff cannot provide observation of children in our lobby area while testing is being conducted. An adult accompanying a patient to their appointment will only be allowed in the ultrasound room at the discretion of the ultrasound technician under special circumstances. We apologize for any inconvenience.  Your physician has requested that you have a Exercise Stress Echocardiogram. Please follow instruction sheet as given.      Stress Echocardiogram Information Sheet                                                      Instructions:    1. You may take your morning medications the morning of the test.  2. Light breakfast no caffeine.  3. Dress prepared to exercise.  4. DO NOT use ANY caffeine or tobacco products 3 hours before appointment.  5. Please bring all current prescription medications.   Please note: We ask at that you not bring children with you during  ultrasound (echo/ vascular) testing. Due to room size and safety concerns, children are not allowed in the ultrasound rooms during exams. Our front office staff cannot provide observation of children in our lobby area while testing is being conducted. An adult accompanying a patient to their appointment will only be allowed in the ultrasound room at the discretion of the ultrasound technician under special circumstances. We apologize for any inconvenience.   ZIO XT- Long Term Monitor Instructions  Your physician has requested you wear a ZIO patch monitor for 14 days.  This is a single patch monitor. [employer] supplies one patch monitor per enrollment. Additional stickers are not available. Please do not apply patch if you will be having a Nuclear Stress Test,  Echocardiogram, Cardiac CT, MRI, or Chest Xray during the period you would be wearing the  monitor. The patch cannot be worn during these tests. You cannot remove and re-apply the  ZIO XT patch monitor.  Your ZIO patch monitor will be mailed 3 day USPS to your address on file. It may take 3-5 days  to receive your monitor after you have been enrolled.  Once you have received your monitor, please review the enclosed instructions. Your monitor  has already been registered assigning a specific monitor serial # to you.  Billing and Patient Assistance Program Information  We have supplied [employer] with any of your insurance information on file for billing  purposes. [employer] offers a sliding scale Patient Assistance Program for patients that do not have  insurance, or whose insurance does not completely cover the cost of the ZIO monitor.  You must apply for the Patient Assistance Program to qualify for this discounted rate.  To apply, please call [employer] at [phone], select option 4, select option 2, ask to apply for  Patient Assistance Program. Meredeth will ask your household income, and how many people  are in your household. They will  quote your out-of-pocket cost based on that information.  [employer] will also be able to set up a 3-month, interest-free payment plan if needed.  Applying the monitor Do not shower for the first 24 hours. You may shower after the first 24 hours.  Press the button if you feel a symptom. You will hear a small click. Record Date, Time and  Symptom in the Patient Logbook.  When you are ready to remove the patch, follow instructions on the last 2 pages of Patient  Logbook. Stick patch monitor onto the last page of Patient Logbook.  Place Patient Logbook in the blue and white box. Use locking tab on box and tape box closed  securely. The blue and white box has prepaid postage on it. Please place it in the mailbox as  soon as possible. Your physician should have your test results approximately 7 days after the  monitor has been mailed back to [facility].  Call [facility] Customer Care at [phone] if you have questions regarding  your ZIO XT patch monitor. Call them immediately if you see an orange light blinking on your  monitor.  If your monitor falls off in less than 4 days, contact our Monitor department at [phone].  If your monitor becomes loose or falls off after 4 days call [employer] at [phone] for  suggestions on securing your monitor   Follow-Up: At [facility], you and your health needs are our priority.  As part of our continuing mission to provide you with exceptional heart care, our providers are all part of one team.  This team includes your primary Cardiologist (physician) and Advanced Practice Providers or APPs (Physician Assistants and Nurse Practitioners) who all work together to provide you with the care you need, when you need it.  Your next appointment:   1 year(s)  Provider:   Jennifer Crape, MD

## 2023-11-14 ENCOUNTER — Encounter: Payer: Self-pay | Admitting: Cardiology

## 2023-11-24 DIAGNOSIS — D485 Neoplasm of uncertain behavior of skin: Secondary | ICD-10-CM | POA: Diagnosis not present

## 2023-12-01 ENCOUNTER — Encounter (HOSPITAL_COMMUNITY): Payer: Self-pay | Admitting: *Deleted

## 2023-12-07 ENCOUNTER — Ambulatory Visit: Payer: Self-pay | Admitting: Cardiology

## 2023-12-07 DIAGNOSIS — R002 Palpitations: Secondary | ICD-10-CM

## 2023-12-14 ENCOUNTER — Ambulatory Visit (HOSPITAL_BASED_OUTPATIENT_CLINIC_OR_DEPARTMENT_OTHER)
Admission: RE | Admit: 2023-12-14 | Discharge: 2023-12-14 | Disposition: A | Discharge status: Home / Self Care | Source: Ambulatory Visit | Attending: Cardiology | Admitting: Cardiology

## 2023-12-14 DIAGNOSIS — R011 Cardiac murmur, unspecified: Secondary | ICD-10-CM | POA: Diagnosis not present

## 2023-12-15 ENCOUNTER — Ambulatory Visit (HOSPITAL_COMMUNITY)
Admission: RE | Admit: 2023-12-15 | Discharge: 2023-12-15 | Disposition: A | Discharge status: Home / Self Care | Source: Ambulatory Visit | Attending: Cardiology | Admitting: Cardiology

## 2023-12-15 DIAGNOSIS — R0609 Other forms of dyspnea: Secondary | ICD-10-CM | POA: Diagnosis not present

## 2023-12-15 LAB — ECHOCARDIOGRAM COMPLETE
AR max vel: 3.05 cm2
AV Area VTI: 2.79 cm2
AV Area mean vel: 3.04 cm2
AV Mean grad: 3 mmHg
AV Peak grad: 4.7 mmHg
Ao pk vel: 1.08 m/s
Area-P 1/2: 4.36 cm2
Calc EF: 65.5 %
MV M vel: 1.25 m/s
MV Peak grad: 6.3 mmHg
S' Lateral: 2.5 cm
Single Plane A2C EF: 61.7 %
Single Plane A4C EF: 67 %

## 2023-12-15 MED ORDER — PERFLUTREN LIPID MICROSPHERE
1.0000 mL | INTRAVENOUS | Status: AC | PRN
  Administered 2023-12-15: 6 mL via INTRAVENOUS

## 2024-01-29 DIAGNOSIS — G43009 Migraine without aura, not intractable, without status migrainosus: Secondary | ICD-10-CM | POA: Diagnosis not present

## 2024-01-29 DIAGNOSIS — G44209 Tension-type headache, unspecified, not intractable: Secondary | ICD-10-CM | POA: Diagnosis not present

## 2024-02-22 ENCOUNTER — Encounter: Payer: Self-pay | Admitting: Student

## 2024-02-22 ENCOUNTER — Ambulatory Visit: Payer: Self-pay

## 2024-02-22 ENCOUNTER — Ambulatory Visit (INDEPENDENT_AMBULATORY_CARE_PROVIDER_SITE_OTHER): Admitting: Student

## 2024-02-22 VITALS — BP 125/81 | HR 90 | Temp 98.2°F | Resp 16 | Ht <= 58 in | Wt 148.4 lb

## 2024-02-22 DIAGNOSIS — R350 Frequency of micturition: Secondary | ICD-10-CM | POA: Diagnosis not present

## 2024-02-22 DIAGNOSIS — R300 Dysuria: Secondary | ICD-10-CM | POA: Diagnosis not present

## 2024-02-22 DIAGNOSIS — F411 Generalized anxiety disorder: Secondary | ICD-10-CM | POA: Diagnosis not present

## 2024-02-22 LAB — POCT URINALYSIS DIPSTICK
Bilirubin, UA: NEGATIVE
Blood, UA: NEGATIVE
Glucose, UA: NEGATIVE
Ketones, UA: NEGATIVE
Leukocytes, UA: NEGATIVE
Nitrite, UA: NEGATIVE
Protein, UA: NEGATIVE
Spec Grav, UA: <=1.005 — AB (ref 1.010–1.025)
Urobilinogen, UA: 0.2 U/dL
pH, UA: 7 (ref 5.0–8.0)

## 2024-02-22 MED ORDER — PHENAZOPYRIDINE HCL 100 MG PO TABS: 100.0000 mg | ORAL_TABLET | Freq: Three times a day (TID) | ORAL | 0 refills | Status: DC | PRN

## 2024-02-22 MED ORDER — NITROFURANTOIN MONOHYD MACRO 100 MG PO CAPS: 100.0000 mg | ORAL_CAPSULE | Freq: Two times a day (BID) | ORAL | 0 refills | Status: AC

## 2024-02-22 NOTE — Telephone Encounter (Signed)
 FYI Only or Action Required?: FYI only for provider: appointment scheduled on 11/6.  Patient was last seen in primary care on 11/01/2023 by Cyndi Shaver, PA-C.  Called Nurse Triage reporting Urinary Frequency.  Symptoms began several days ago.  Interventions attempted: Nothing.  Symptoms are: unchanged.  Triage Disposition: See Physician Within 24 Hours  Patient/caregiver understands and will follow disposition?: Yes         Copied from CRM #[id number]. Topic: Clinical - Red Word Triage >> Feb 22, 2024  8:44 AM Robinson H wrote: [facility] that prompted transfer to Nurse Triage: Possible UTI discomfort/pain and urgency to urinate but nothing really coming out Reason for Disposition  Urinating more frequently than usual (i.e., frequency) OR new-onset of the feeling of an urgent need to urinate (i.e., urgency)  Answer Assessment - Initial Assessment Questions 1. SYMPTOM: What's the main symptom you're concerned about? (e.g., frequency, incontinence)      discomfort/pain and urgency, and frequency, burning     2. ONSET: When did the symptoms start?     X 4 days   3. PAIN: Is there any pain? If Yes, ask: How bad is it? (Scale: 1-10; mild, moderate, severe)     No   4. CAUSE: What do you think is causing the symptoms?     Possible UTI   5. OTHER SYMPTOMS: Do you have any other symptoms? (e.g., blood in urine, fever, flank pain, pain with urination)  Flank pain  At home UTI test indicated possible UTI. Appt. Scheduled.  Protocols used: Urinary Symptoms-A-AH

## 2024-02-22 NOTE — Telephone Encounter (Signed)
 Pt has scheduled OV 02/22/2024.

## 2024-02-22 NOTE — Patient Instructions (Signed)

## 2024-02-22 NOTE — Progress Notes (Signed)
 Chief Complaint  Patient presents with   Urinary Tract Infection    Patient feels like she has an UTI: Symptoms of urgency with little urine output ; some back pain/pressure     Kristi Robertson is a 38 y.o. female here for urinary symptoms.   Duration: 4 days. Symptoms: Dysuria, urinary frequency and urinary hesitancy, dysuria Denies: hematuria, fever, chills, flank pain, sweats, nausea, and vomiting, vaginal discharge Hx of recurrent UTI? No Denies new sexual partners.  GAD Additionally patient was prescribed Lexapro  last office visit for general anxiety.  Patient reports that she had only taken the medicine for 3 days.  Felt significant nausea when taking the medicine and stopped.  Patient still struggling with anxiety, feels anxious when leaving her home, or going out to do things such as coming to clinic/errands   Patient denies fever, chills, SOB, CP, palpitations, dyspnea, edema, HA, vision changes, N/V/D, abdominal pain, rash, weight changes, and recent illness or hospitalizations.   Past Medical History:  Diagnosis Date   Acid reflux    Anxiety    Chronic Epstein Barr virus (EBV) infection    Headache    migraines, cluster   Ovarian cyst    left side   PONV (postoperative nausea and vomiting)    severe   Vitamin D  deficiency    Wears glasses      BP 125/81 (BP Location: Right Arm, Patient Position: Sitting, Cuff Size: Normal)   Pulse 90   Temp 98.2 F (36.8 C) (Oral)   Resp 16   Ht 4' 9 (1.448 m)   Wt 148 lb 6.4 oz (67.3 kg)   SpO2 98%   BMI 32.11 kg/m  General: Awake, alert, appears stated age Heart: RRR Lungs: CTAB, normal respiratory effort, no accessory muscle usage Abd: BS+, soft, NT, ND, no masses or organomegaly MSK: No CVA tenderness,  Psych: Age appropriate judgment and insight  Urine frequency - Plan: nitrofurantoin, macrocrystal-monohydrate, (MACROBID) 100 MG capsule, POCT Urinalysis Dipstick, Urine Culture  Dysuria - Plan: phenazopyridine  (PYRIDIUM) 100 MG tablet  Generalized anxiety disorder - Plan: Ambulatory referral to Behavioral Health   GAD -Referral sent to behavioral health for further evaluation  Urinary frequency Rx Macrobid Urine culture pending Stay hydrated. Seek immediate care if pt starts to develop fevers, new/worsening symptoms, uncontrollable N/V. F/u prn. The patient voiced understanding and agreement to the plan.   [name], DNP, AGNP-C 02/22/24 11:44 AM

## 2024-02-23 LAB — URINE CULTURE
MICRO NUMBER:: 17200475
Result:: NO GROWTH
SPECIMEN QUALITY:: ADEQUATE

## 2024-03-06 DIAGNOSIS — L814 Other melanin hyperpigmentation: Secondary | ICD-10-CM | POA: Diagnosis not present

## 2024-03-06 DIAGNOSIS — D2262 Melanocytic nevi of left upper limb, including shoulder: Secondary | ICD-10-CM | POA: Diagnosis not present

## 2024-03-06 DIAGNOSIS — D225 Melanocytic nevi of trunk: Secondary | ICD-10-CM | POA: Diagnosis not present

## 2024-03-06 DIAGNOSIS — D2261 Melanocytic nevi of right upper limb, including shoulder: Secondary | ICD-10-CM | POA: Diagnosis not present

## 2024-03-08 DIAGNOSIS — Z860100 Personal history of colon polyps, unspecified: Secondary | ICD-10-CM | POA: Diagnosis not present

## 2024-03-08 DIAGNOSIS — K219 Gastro-esophageal reflux disease without esophagitis: Secondary | ICD-10-CM | POA: Diagnosis not present

## 2024-03-29 ENCOUNTER — Ambulatory Visit: Admitting: Student

## 2024-03-29 ENCOUNTER — Encounter: Payer: Self-pay | Admitting: Student

## 2024-03-29 ENCOUNTER — Telehealth: Admitting: Physician Assistant

## 2024-03-29 VITALS — BP 135/83 | HR 85 | Ht <= 58 in | Wt 148.8 lb

## 2024-03-29 DIAGNOSIS — M545 Low back pain, unspecified: Secondary | ICD-10-CM | POA: Diagnosis not present

## 2024-03-29 DIAGNOSIS — M549 Dorsalgia, unspecified: Secondary | ICD-10-CM

## 2024-03-29 LAB — POC URINALSYSI DIPSTICK (AUTOMATED)
Bilirubin, UA: NEGATIVE
Blood, UA: NEGATIVE
Glucose, UA: NEGATIVE
Ketones, UA: NEGATIVE
Leukocytes, UA: NEGATIVE
Nitrite, UA: NEGATIVE
Protein, UA: NEGATIVE
Spec Grav, UA: <=1.005 — AB (ref 1.010–1.025)
Urobilinogen, UA: 0.2 U/dL
pH, UA: 6.5 (ref 5.0–8.0)

## 2024-03-29 MED ORDER — MELOXICAM 7.5 MG PO TABS: 7.5000 mg | ORAL_TABLET | Freq: Every day | ORAL | 0 refills | Status: AC

## 2024-03-29 NOTE — Progress Notes (Signed)
 Musculoskeletal Exam  Patient: Kristi Robertson DOB: December 15, 1985  DOS: 03/29/2024  SUBJECTIVE:  Chief Complaint:   No chief complaint on file.   Kristi Robertson is a 38 y.o.  female for evaluation and treatment of back pain.   Patient reports mid to upper back pain for 2 days, rated 4/10. No known injury. Pain does not radiate to the legs, abdomen, or groin, and there is no numbness or weakness. She has had similar intermittent pain in the past.   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 She is a side sleeper and often sleeps with one leg up. Denies dysuria or urinary issues. No history of cancer, osteoporosis, arthritis, spine surgery.   Patient denies fever, chills, SOB, CP, palpitations, dyspnea, edema, HA, vision changes, N/V/D, abdominal pain, urinary symptoms, rash, weight changes, and recent illness or hospitalizations.   Past Medical History:  Diagnosis Date   Acid reflux    Anxiety    Chronic Epstein Barr virus (EBV) infection    Headache    migraines, cluster   Ovarian cyst    left side   PONV (postoperative nausea and vomiting)    severe   Vitamin D  deficiency    Wears glasses     Objective:  VITAL SIGNS: BP 135/83   Pulse 85   Ht 4' 9 (1.448 m)   Wt 148 lb 12.8 oz (67.5 kg)   SpO2 98%   BMI 32.20 kg/m  Constitutional: Well formed, well developed. No acute distress. HENT: Normocephalic, atraumatic.  Thorax & Lungs:  No accessory muscle use Musculoskeletal: low back.   Tenderness to palpation: yes Deformity: no Ecchymosis: no Poor hamstring flexibility b/l. Neurologic: Normal sensory function. No focal deficits noted. DTR's equal and symmetric in LE's. No clonus. Psychiatric: Normal mood. Age appropriate judgment and insight. Alert & oriented x 3.    Assessment:  Acute bilateral low back pain without sciatica - Plan: POCT Urinalysis Dipstick (Automated), meloxicam (MOBIC) 7.5 MG  tablet  Plan: -Rx Meloxicam - Advised against concurrent use of Mobic and ibuprofen  due to GI bleeding risk. - Recommended Tylenol  500 mg every 4-6 hours for breakthrough pain, max 3000 mg/day. - Suggested moist heat and Voltaren gel for relief. - Encouraged light stretching exercises - Advised ergonomic adjustments at work. -UA clear Stretches/exercises, may use moist heat F/u prn. The patient voiced understanding and agreement to the plan.   Kristi LITTIE Jolly, DO 03/29/2024  4:04 PM

## 2024-03-29 NOTE — Progress Notes (Signed)
°  Because of the nature of your pain and desire for testing, I feel your condition warrants further evaluation and I recommend that you be seen in a face-to-face visit.   NOTE: There will be NO CHARGE for this E-Visit   If you are having a true medical emergency, please call 911.     For an urgent face to face visit, [facility] has multiple urgent care centers for your convenience.  Click the link below for the full list of locations and hours, walk-in wait times, appointment scheduling options and driving directions:  Urgent Care - [place], [place], [place], [place], [place], [place]  [place]     Your MyChart E-visit questionnaire answers were reviewed by a board certified advanced clinical practitioner to complete your personal care plan based on your specific symptoms.    Thank you for using e-Visits.

## 2024-05-22 ENCOUNTER — Ambulatory Visit: Admitting: Student

## 2024-05-22 ENCOUNTER — Encounter: Payer: Self-pay | Admitting: Student

## 2024-05-22 VITALS — BP 116/76 | HR 78 | Temp 98.0°F | Resp 16 | Ht <= 58 in | Wt 148.0 lb

## 2024-05-22 DIAGNOSIS — G43009 Migraine without aura, not intractable, without status migrainosus: Secondary | ICD-10-CM

## 2024-05-22 DIAGNOSIS — K219 Gastro-esophageal reflux disease without esophagitis: Secondary | ICD-10-CM

## 2024-05-22 DIAGNOSIS — L732 Hidradenitis suppurativa: Secondary | ICD-10-CM | POA: Insufficient documentation

## 2024-05-22 DIAGNOSIS — F411 Generalized anxiety disorder: Secondary | ICD-10-CM

## 2024-05-22 DIAGNOSIS — E559 Vitamin D deficiency, unspecified: Secondary | ICD-10-CM | POA: Insufficient documentation

## 2024-05-22 MED ORDER — FLUOXETINE HCL 10 MG PO CAPS: ORAL_CAPSULE | ORAL | 0 refills | Status: AC

## 2024-05-22 NOTE — Patient Instructions (Signed)

## 2024-05-22 NOTE — Assessment & Plan Note (Addendum)
 Following with Neurology. Encouraged increased hydration, 64 ounces of clear fluids daily. Minimize alcohol and caffeine. Eat small frequent meals with lean proteins and complex carbs. Avoid high and low blood sugars. Get adequate sleep, 7-8 hours a night. Needs exercise daily preferably in the morning.

## 2024-05-22 NOTE — Assessment & Plan Note (Signed)
 Intermittent anxiety >3 days per week. Previous Lexapro  trial not tolerated.  -Rx. Start Prozac   10 mg daily for one week, then increase to 20 mg daily. - Resent referral to behavioral health for counseling. - Provided list of local counseling providers. FU 6 weeks

## 2024-05-22 NOTE — Assessment & Plan Note (Signed)
 Supplement and Monitor

## 2024-05-22 NOTE — Assessment & Plan Note (Signed)
Managed with protonix daily

## 2024-05-22 NOTE — Assessment & Plan Note (Signed)
 Chronic condition with stress-related flare-ups. Managed with topical clindamycin. Dermatology follow-up ongoing. - Continue topical clindamycin. - Continue dermatology follow-up.

## 2024-05-22 NOTE — Progress Notes (Signed)
 "  Subjective:     Patient ID: Kristi Robertson, female    DOB: 1986/01/21, 39 y.o.   MRN: [id number]  Chief Complaint  Patient presents with   Transitions Of Care    Patient is here for a transfer of care to a new provider.   Anxiety    Patient complains of having an increase in anxiety in certain situations    HPI  Discussed the use of AI scribe software for clinical note transcription with the patient, who gave verbal consent to proceed.    History of Present Illness         History of Present Illness Kristi Robertson is a 39 year old female presents for [facility]  She has migraines managed by neurology.  She currently controls migraines mainly by limiting caffeine. Migraines began after her hysterectomy in 2013.  Her anxiety occurs about three times a week, sometimes without a clear trigger, and at times makes her reluctant to leave the house. Her mother has anxiety with panic attacks. Lexapro  for anxiety caused severe sweating, a bad migraine, and insomnia, so it was stopped.   She has Hx of hidradenitis suppurativa diagnosed about 5-6 years ago, with flares every few months, more often during stress. Lesions involve her armpits, groin, and occasionally under her breasts. She uses topical clindamycin for flares and follows with dermatology.  She had a hysterectomy and retains one ovary. She has not had menstrual periods for about ten years.   Patient denies fever, chills, SOB, CP, palpitations, dyspnea, edema, HA, vision changes, N/V/D, abdominal pain, urinary symptoms, rash, weight changes, and recent illness or hospitalizations.         05/22/2024    1:21 PM 02/22/2024   11:15 AM 11/28/2022    2:12 PM  GAD 7 : Generalized Anxiety Score  Nervous, Anxious, on Edge 1 0  0   Control/stop worrying 0 0  0   Worry too much - different things 0 0  0   Trouble relaxing 0 0  1   Restless 0 0  0   Easily annoyed or irritable 0 0  0   Afraid - awful might happen 0 0  0   Total GAD  7 Score 1 0 1  Anxiety Difficulty Not difficult at all Not difficult at all Not difficult at all     Data saved with a previous flowsheet row definition        05/22/2024    1:21 PM 02/22/2024   11:15 AM 11/28/2022    2:11 PM  Depression screen PHQ 2/9  Decreased Interest 0 0 0  Down, Depressed, Hopeless 0 0 0  PHQ - 2 Score 0 0 0  Altered sleeping 1 0   Tired, decreased energy 1 0   Change in appetite 0 0   Feeling bad or failure about yourself  0 0   Trouble concentrating 0 0   Moving slowly or fidgety/restless 0 0   Suicidal thoughts 0 0   PHQ-9 Score 2 0   Difficult doing work/chores Not difficult at all Not difficult at all        Health Maintenance Due  Topic Date Due   HIV Screening  Never done   Hepatitis C Screening  Never done   DTaP/Tdap/Td (1 - Tdap) Never done   Hepatitis B Vaccines 19-59 Average Risk (1 of 3 - 19+ 3-dose series) Never done   COVID-19 Vaccine (4 - 2025-26 season) 12/18/2023    Past Medical  History:  Diagnosis Date   Acid reflux    Anxiety    Chronic Epstein Barr virus (EBV) infection    Headache    migraines, cluster   Hidradenitis suppurativa    axilla b/l, groin   Ovarian cyst    left side   PONV (postoperative nausea and vomiting)    severe   Vitamin D  deficiency    Wears glasses     Past Surgical History:  Procedure Laterality Date   ABDOMINAL HYSTERECTOMY  10/20/2011   full   CHOLECYSTECTOMY N/A 03/27/2014   Procedure: LAPAROSCOPIC CHOLECYSTECTOMY WITH INTRAOPERATIVE CHOLANGIOGRAM;  Surgeon: Deward Null III, MD;  Location: [facility] OR;  Service: General;  Laterality: N/A;   cyst removed from ovary Right 10/16/2008   excision to groin Left    I and D multiple times   fallopian tube and ovary removed  10/16/2009   INGUINAL HERNIA REPAIR N/A 07/12/2013   Procedure: EXCISION HIDRADENITIS GROIN;  Surgeon: Deward GORMAN Null DOUGLAS, MD;  Location: [facility] ORS;  Service: General;  Laterality: N/A;    Family History  Problem Relation Age of Onset    Hypertension Mother    Hypercholesterolemia Mother    Ovarian cancer Maternal Grandmother    Diabetes Maternal Aunt    Uterine cancer Maternal Aunt    Stroke Maternal Uncle    Diabetes Maternal Uncle        x3   Colon cancer Neg Hx    Colon polyps Neg Hx    Kidney disease Neg Hx    Esophageal cancer Neg Hx    Gallbladder disease Neg Hx     Social History   Socioeconomic History   Marital status: Married    Spouse name: Not on file   Number of children: 2   Years of education: Not on file   Highest education level: Not on file  Occupational History   Occupation: Medical Billing  Tobacco Use   Smoking status: Never   Smokeless tobacco: Never  Substance and Sexual Activity   Alcohol use: Yes    Comment: occasional, 1-2 beers / week   Drug use: No   Sexual activity: Yes    Birth control/protection: Surgical  Other Topics Concern   Not on file  Social History Narrative   Not on file   Social Drivers of Health   Tobacco Use: Low Risk (05/22/2024)   Patient History    Smoking Tobacco Use: Never    Smokeless Tobacco Use: Never    Passive Exposure: Not on file  Financial Resource Strain: Not on file  Food Insecurity: Not on file  Transportation Needs: Not on file  Physical Activity: Not on file  Stress: Not on file  Social Connections: Not on file  Intimate Partner Violence: Not on file  Depression (PHQ2-9): Low Risk (05/22/2024)   Depression (PHQ2-9)    PHQ-2 Score: 2  Alcohol Screen: Not on file  Housing: Not on file  Utilities: Not on file  Health Literacy: Not on file    Outpatient Medications Prior to Visit  Medication Sig Dispense Refill   cholecalciferol (VITAMIN D3) 25 MCG (1000 UNIT) tablet Take 1,000 Units by mouth daily.     clindamycin-benzoyl peroxide (BENZACLIN) gel SMARTSIG:sparingly Topical Every Morning     famotidine (PEPCID) 40 MG tablet Take 40 mg by mouth daily.     ibuprofen  (ADVIL ,MOTRIN ) 200 MG tablet Take 400 mg by mouth every 6 (six)  hours as needed for headache.      meloxicam  (MOBIC )  7.5 MG tablet Take 1 tablet (7.5 mg total) by mouth daily. 15 tablet 0   ondansetron  (ZOFRAN  ODT) 4 MG disintegrating tablet Take 1 tablet (4 mg total) by mouth every 8 (eight) hours as needed. 60 tablet 1   pantoprazole  (PROTONIX ) 40 MG tablet Take 1 tablet (40 mg total) by mouth daily. 90 tablet 3   Rimegepant Sulfate (NURTEC) 75 MG TBDP Take 1 tab ONCE daily at onset of headache. No more than 1 tablet per day.     traZODone  (DESYREL ) 50 MG tablet TAKE 1/2 TO 1 TABLET (25 TO 50 MG TOTAL) BY MOUTH AT BEDTIME 90 tablet 1   mupirocin  ointment (BACTROBAN ) 2 % Apply 1 Application topically 2 (two) times daily. 22 g 0   azelastine  (ASTELIN ) 0.1 % nasal spray Place 1 spray into both nostrils 2 (two) times daily. Use in each nostril as directed 30 mL 0   cyproheptadine (PERIACTIN) 4 MG tablet Take 1 tablet by mouth at bedtime.     methocarbamol (ROBAXIN) 500 MG tablet Take by mouth.     phenazopyridine  (PYRIDIUM ) 100 MG tablet Take 1 tablet (100 mg total) by mouth 3 (three) times daily as needed for pain. 10 tablet 0   No facility-administered medications prior to visit.    Allergies[1]  ROS    See HPI Objective:    Physical Exam Vitals reviewed.  Constitutional:      General: She is not in acute distress.    Appearance: She is not toxic-appearing.  HENT:     Head: Normocephalic and atraumatic.     Mouth/Throat:     Mouth: Mucous membranes are moist.     Pharynx: Oropharynx is clear.  Eyes:     Pupils: Pupils are equal, round, and reactive to light.  Cardiovascular:     Rate and Rhythm: Normal rate and regular rhythm.     Pulses: Normal pulses.     Heart sounds: Normal heart sounds. No murmur heard. Pulmonary:     Effort: Pulmonary effort is normal. No respiratory distress.     Breath sounds: Normal breath sounds. No wheezing.  Musculoskeletal:        General: No swelling.     Cervical back: Neck supple.  Skin:    General:  Skin is warm and dry.  Neurological:     General: No focal deficit present.     Mental Status: She is alert and oriented to person, place, and time.  Psychiatric:        Mood and Affect: Mood normal.        Behavior: Behavior normal.        Thought Content: Thought content normal.        Judgment: Judgment normal.         BP 116/76 (BP Location: Right Arm, Patient Position: Sitting, Cuff Size: Normal)   Pulse 78   Temp 98 F (36.7 C) (Oral)   Resp 16   Ht 4' 9 (1.448 m)   Wt 148 lb (67.1 kg)   SpO2 99%   BMI 32.03 kg/m  Wt Readings from Last 3 Encounters:  05/22/24 148 lb (67.1 kg)  03/29/24 148 lb 12.8 oz (67.5 kg)  02/22/24 148 lb 6.4 oz (67.3 kg)       Assessment & Plan:   Problem List Items Addressed This Visit       Cardiovascular and Mediastinum   Migraine without aura and without status migrainosus, not intractable   Following with Neurology. Encouraged increased hydration,  64 ounces of clear fluids daily. Minimize alcohol and caffeine. Eat small frequent meals with lean proteins and complex carbs. Avoid high and low blood sugars. Get adequate sleep, 7-8 hours a night. Needs exercise daily preferably in the morning.       Relevant Medications   Rimegepant Sulfate (NURTEC) 75 MG TBDP   FLUoxetine  (PROZAC ) 10 MG capsule     Digestive   Gastroesophageal reflux disease without esophagitis   Managed with protonix  daily       Relevant Medications   famotidine (PEPCID) 40 MG tablet     Musculoskeletal and Integument   Hidradenitis suppurativa   Chronic condition with stress-related flare-ups. Managed with topical clindamycin. Dermatology follow-up ongoing. - Continue topical clindamycin. - Continue dermatology follow-up.        Other   Generalized anxiety disorder - Primary   Intermittent anxiety >3 days per week. Previous Lexapro  trial not tolerated.  -Rx. Start Prozac   10 mg daily for one week, then increase to 20 mg daily. - Resent referral to  behavioral health for counseling. - Provided list of local counseling providers. FU 6 weeks      Relevant Medications   FLUoxetine  (PROZAC ) 10 MG capsule   Other Relevant Orders   Ambulatory referral to Behavioral Health   Vitamin D  (25 hydroxy)   TSH   CBC   Basic Metabolic Panel (BMET)   Vitamin B12   Vitamin D  deficiency   Supplement and Monitor.      FU in 6 weeks   I have discontinued Evelyna Lyall's methocarbamol, cyproheptadine, azelastine , mupirocin  ointment, and phenazopyridine . I am also having her start on FLUoxetine . Additionally, I am having her maintain her ibuprofen , pantoprazole , ondansetron , traZODone , cholecalciferol, meloxicam , clindamycin-benzoyl peroxide, famotidine, and Nurtec.  Meds ordered this encounter  Medications   FLUoxetine  (PROZAC ) 10 MG capsule    Sig: Take 1 capsule (10 mg total) by mouth daily for 7 days, THEN 2 capsules (20 mg total) daily.    Dispense:  77 capsule    Refill:  0    Supervising Provider:   DOMENICA BLACKBIRD A [[id number]]      [1]  Allergies Allergen Reactions   Amoxicillin Nausea And Vomiting   Ancef [Cefazolin] Hives and Itching   Bactrim [Sulfamethoxazole-Trimethoprim] Other (See Comments)    Heart racing   Codeine Nausea And Vomiting   Demerol [Meperidine] Nausea And Vomiting   Keflex [Cephalexin] Other (See Comments)    Heart racing   Sulfa Antibiotics Other (See Comments)    Heart racing   Topiramate Other (See Comments)    Didn't like the way it made her feel Didn't like the way it made her feel Didn't like the way it made her feel    Lexapro  [Escitalopram ] Nausea Only   "

## 2024-05-23 ENCOUNTER — Ambulatory Visit: Payer: Self-pay | Admitting: Student

## 2024-05-23 DIAGNOSIS — E559 Vitamin D deficiency, unspecified: Secondary | ICD-10-CM

## 2024-05-23 LAB — BASIC METABOLIC PANEL WITH GFR
BUN: 6 mg/dL (ref 6–23)
CO2: 29 meq/L (ref 19–32)
Calcium: 9.6 mg/dL (ref 8.4–10.5)
Chloride: 102 meq/L (ref 96–112)
Creatinine, Ser: 0.66 mg/dL (ref 0.40–1.20)
GFR: 110.67 mL/min
Glucose, Bld: 86 mg/dL (ref 70–99)
Potassium: 4.5 meq/L (ref 3.5–5.1)
Sodium: 139 meq/L (ref 135–145)

## 2024-05-23 LAB — CBC
HCT: 40.8 % (ref 36.0–46.0)
Hemoglobin: 14 g/dL (ref 12.0–15.0)
MCHC: 34.4 g/dL (ref 30.0–36.0)
MCV: 88.2 fl (ref 78.0–100.0)
Platelets: 397 10*3/uL (ref 150.0–400.0)
RBC: 4.62 Mil/uL (ref 3.87–5.11)
RDW: 13.9 % (ref 11.5–15.5)
WBC: 10.1 10*3/uL (ref 4.0–10.5)

## 2024-05-23 LAB — TSH: TSH: 1.9 u[IU]/mL (ref 0.35–5.50)

## 2024-05-23 LAB — VITAMIN B12: Vitamin B-12: 205 pg/mL — ABNORMAL LOW (ref 211–911)

## 2024-05-23 LAB — VITAMIN D 25 HYDROXY (VIT D DEFICIENCY, FRACTURES): VITD: 20.36 ng/mL — ABNORMAL LOW (ref 30.00–100.00)

## 2024-05-23 MED ORDER — VITAMIN D (ERGOCALCIFEROL) 1.25 MG (50000 UNIT) PO CAPS: 50000.0000 [IU] | ORAL_CAPSULE | ORAL | 3 refills | Status: AC

## 2024-05-23 MED ORDER — THERA VITAL M PO TABS: 1.0000 | ORAL_TABLET | Freq: Every day | ORAL | Status: AC

## 2024-07-03 ENCOUNTER — Ambulatory Visit: Admitting: Student
